# Patient Record
Sex: Female | Born: 1965 | Race: Black or African American | Hispanic: No | Marital: Married | Smoking: Never smoker
Health system: Southern US, Community
[De-identification: ages and names within clinical notes are randomized; demographics above are authoritative.]

## PROBLEM LIST (undated history)

## (undated) DIAGNOSIS — I471 Supraventricular tachycardia, unspecified: Secondary | ICD-10-CM

## (undated) DIAGNOSIS — M7541 Impingement syndrome of right shoulder: Secondary | ICD-10-CM

## (undated) DIAGNOSIS — M7580 Other shoulder lesions, unspecified shoulder: Secondary | ICD-10-CM

## (undated) HISTORY — PX: ENDOMETRIAL ABLATION: SHX621

## (undated) HISTORY — PX: PELVIC LAPAROSCOPY: SHX162

---

## 2007-05-07 ENCOUNTER — Ambulatory Visit: Payer: Self-pay | Admitting: Obstetrics and Gynecology

## 2008-05-19 ENCOUNTER — Ambulatory Visit: Payer: Self-pay | Admitting: Obstetrics and Gynecology

## 2009-01-29 ENCOUNTER — Emergency Department: Payer: Self-pay | Admitting: Emergency Medicine

## 2009-06-01 ENCOUNTER — Ambulatory Visit: Payer: Self-pay | Admitting: Obstetrics and Gynecology

## 2010-06-15 ENCOUNTER — Ambulatory Visit: Payer: Self-pay | Admitting: Women's Health

## 2011-06-27 ENCOUNTER — Ambulatory Visit: Payer: Self-pay | Admitting: Obstetrics and Gynecology

## 2012-08-20 ENCOUNTER — Ambulatory Visit: Payer: Self-pay

## 2015-11-13 ENCOUNTER — Ambulatory Visit
Admission: EM | Admit: 2015-11-13 | Discharge: 2015-11-13 | Disposition: A | Discharge status: Home / Self Care | Payer: BLUE CROSS/BLUE SHIELD | Attending: Family Medicine | Admitting: Family Medicine

## 2015-11-13 ENCOUNTER — Encounter: Payer: Self-pay | Admitting: Gynecology

## 2015-11-13 DIAGNOSIS — J40 Bronchitis, not specified as acute or chronic: Secondary | ICD-10-CM

## 2015-11-13 HISTORY — DX: Other shoulder lesions, unspecified shoulder: M75.80

## 2015-11-13 HISTORY — DX: Impingement syndrome of right shoulder: M75.41

## 2015-11-13 MED ORDER — BENZONATATE 200 MG PO CAPS: 200.0000 mg | ORAL_CAPSULE | Freq: Three times a day (TID) | ORAL | Status: DC

## 2015-11-13 MED ORDER — HYDROCOD POLST-CPM POLST ER 10-8 MG/5ML PO SUER: 5.0000 mL | Freq: Two times a day (BID) | ORAL | Status: DC

## 2015-11-13 MED ORDER — FLUTICASONE PROPIONATE 50 MCG/ACT NA SUSP: 2.0000 | Freq: Every day | NASAL | Status: AC

## 2015-11-13 MED ORDER — AZITHROMYCIN 250 MG PO TABS: ORAL_TABLET | ORAL | Status: DC

## 2015-11-13 NOTE — ED Provider Notes (Signed)
CSN: [id number]     Arrival date & time 11/13/15  2355 History   First MD Initiated Contact with Patient 11/13/15 [phone]     Chief Complaint  Patient presents with  . URI   (Consider location/radiation/quality/duration/timing/severity/associated sxs/prior Treatment) HPI   51 year old female who presents with left ear pain congestion and cough that she's had for 10 days. He said initially she had a laryngitis that eventually he has now become more of a cough and left ear pain with recent feverish feeling. Temperature today 98.1 pulse 84 blood pressure 102/70 with O2 sats of 99%.  Past Medical History  Diagnosis Date  . Rotator cuff tendinitis   . Impingement syndrome of right shoulder    Past Surgical History  Procedure Laterality Date  . Endometrial ablation    . Pelvic laparoscopy     No family history on file. Social History  Substance Use Topics  . Smoking status: Never Smoker   . Smokeless tobacco: None  . Alcohol Use: Yes   OB History    No data available     Review of Systems  Constitutional: Positive for fever, activity change and fatigue.  HENT: Positive for postnasal drip, rhinorrhea and sinus pressure.   Respiratory: Positive for cough.   All other systems reviewed and are negative.   Allergies  Oxycodone and Sulfa antibiotics  Home Medications   Prior to Admission medications   Medication Sig Start Date End Date Taking? Authorizing Provider  Multiple Vitamin (MULTIVITAMIN) tablet Take 1 tablet by mouth daily.   Yes Historical Provider, MD  azithromycin (ZITHROMAX Z-PAK) 250 MG tablet Use as per package instructions 11/13/15   Lutricia Feil, PA-C  benzonatate (TESSALON) 200 MG capsule Take 1 capsule (200 mg total) by mouth every 8 (eight) hours. PRN cough 11/13/15   Lutricia Feil, PA-C  chlorpheniramine-HYDROcodone [facility]) 10-8 MG/5ML SUER Take 5 mLs by mouth 2 (two) times daily. 11/13/15   Lutricia Feil, PA-C  fluticasone (FLONASE)  50 MCG/ACT nasal spray Place 2 sprays into both nostrils daily. 11/13/15   Lutricia Feil, PA-C   Meds Ordered and Administered this Visit  Medications - No data to display  BP 107/70 mmHg  Pulse 84  Temp(Src) 98.1 F (36.7 C) (Oral)  Ht  (1.626 m)  Wt 160 lb (72.576 kg)  BMI 27.45 kg/m2  SpO2 99%  LMP  No data found.   Physical Exam  Constitutional: She is oriented to person, place, and time. She appears well-developed and well-nourished. No distress.  HENT:  Head: Normocephalic and atraumatic.  Right Ear: External ear normal.  Nose: Nose normal.  Mouth/Throat: Oropharynx is clear and moist. No oropharyngeal exudate.  Left TM is dull  Eyes: Conjunctivae are normal. Pupils are equal, round, and reactive to light.  Neck: Normal range of motion. Neck supple.  Pulmonary/Chest: Effort normal and breath sounds normal. No respiratory distress. She has no wheezes. She has no rales.  Musculoskeletal: Normal range of motion. She exhibits no edema or tenderness.  Lymphadenopathy:    She has no cervical adenopathy.  Neurological: She is alert and oriented to person, place, and time.  Skin: Skin is warm and dry. She is not diaphoretic.  Psychiatric: She has a normal mood and affect. Her behavior is normal. Judgment and thought content normal.  Nursing note and vitals reviewed.   ED Course  Procedures (including critical care time)  Labs Review Labs Reviewed - No data to display  Imaging Review  No results found.   Visual Acuity Review  Right Eye Distance:   Left Eye Distance:   Bilateral Distance:    Right Eye Near:   Left Eye Near:    Bilateral Near:         MDM   1. Bronchitis    Discharge Medication List as of 11/13/2015  9:51 AM    START taking these medications   Details  azithromycin (ZITHROMAX Z-PAK) 250 MG tablet Use as per package instructions, Normal    benzonatate (TESSALON) 200 MG capsule Take 1 capsule (200 mg total) by mouth every 8 (eight)  hours. PRN cough, Starting 11/13/2015, Until Discontinued, Normal    chlorpheniramine-HYDROcodone (TUSSIONEX PENNKINETIC ER) 10-8 MG/5ML SUER Take 5 mLs by mouth 2 (two) times daily., Starting 11/13/2015, Until Discontinued, Print    fluticasone (FLONASE) 50 MCG/ACT nasal spray Place 2 sprays into both nostrils daily., Starting 11/13/2015, Until Discontinued, Normal      Plan: 1. Test/x-ray results and diagnosis reviewed with patient 2. rx as per orders; risks, benefits, potential side effects reviewed with patient 3. Recommend supportive treatment with Fluids and rest. Also start her on Flonase for drainage and have recommended the use of Zyrtec Claritin or Allegra. Also start her on a Z-Pak causes length of time she's had the illness which seems to be worsening. If she does not improve she follow-up with her primary care physician or return to our clinic .[facility]. 4. F/u prn if symptoms worsen or don't improve     Lutricia FeilWilliam P Calvary Difranco, PA-C 11/13/15 [phone]

## 2015-11-13 NOTE — ED Notes (Signed)
Patient c/o congestion / cough / left ear pain x 10 days.

## 2015-11-13 NOTE — Discharge Instructions (Signed)
Upper Respiratory Infection, Adult Most upper respiratory infections (URIs) are a viral infection of the air passages leading to the lungs. A URI affects the nose, throat, and upper air passages. The most common type of URI is nasopharyngitis and is typically referred to as "the common cold." URIs run their course and usually go away on their own. Most of the time, a URI does not require medical attention, but sometimes a bacterial infection in the upper airways can follow a viral infection. This is called a secondary infection. Sinus and middle ear infections are common types of secondary upper respiratory infections. Bacterial pneumonia can also complicate a URI. A URI can worsen asthma and chronic obstructive pulmonary disease (COPD). Sometimes, these complications can require emergency medical care and may be life threatening.  CAUSES Almost all URIs are caused by viruses. A virus is a type of germ and can spread from one person to another.  RISKS FACTORS You may be at risk for a URI if:   You smoke.   You have chronic heart or lung disease.  You have a weakened defense (immune) system.   You are very young or very old.   You have nasal allergies or asthma.  You work in crowded or poorly ventilated areas.  You work in health care facilities or schools. SIGNS AND SYMPTOMS  Symptoms typically develop 2-3 days after you come in contact with a cold virus. Most viral URIs last 7-10 days. However, viral URIs from the influenza virus (flu virus) can last 14-18 days and are typically more severe. Symptoms may include:   Runny or stuffy (congested) nose.   Sneezing.   Cough.   Sore throat.   Headache.   Fatigue.   Fever.   Loss of appetite.   Pain in your forehead, behind your eyes, and over your cheekbones (sinus pain).  Muscle aches.  DIAGNOSIS  Your health care provider may diagnose a URI by:  Physical exam.  Tests to check that your symptoms are not due to  another condition such as:  Strep throat.  Sinusitis.  Pneumonia.  Asthma. TREATMENT  A URI goes away on its own with time. It cannot be cured with medicines, but medicines may be prescribed or recommended to relieve symptoms. Medicines may help:  Reduce your fever.  Reduce your cough.  Relieve nasal congestion. HOME CARE INSTRUCTIONS   Take medicines only as directed by your health care provider.   Gargle warm saltwater or take cough drops to comfort your throat as directed by your health care provider.  Use a warm mist humidifier or inhale steam from a shower to increase air moisture. This may make it easier to breathe.  Drink enough fluid to keep your urine clear or pale yellow.   Eat soups and other clear broths and maintain good nutrition.   Rest as needed.   Return to work when your temperature has returned to normal or as your health care provider advises. You may need to stay home longer to avoid infecting others. You can also use a face mask and careful hand washing to prevent spread of the virus.  Increase the usage of your inhaler if you have asthma.   Do not use any tobacco products, including cigarettes, chewing tobacco, or electronic cigarettes. If you need help quitting, ask your health care provider. PREVENTION  The best way to protect yourself from getting a cold is to practice good hygiene.   Avoid oral or hand contact with people with cold   symptoms.   Wash your hands often if contact occurs.  There is no clear evidence that vitamin C, vitamin E, echinacea, or exercise reduces the chance of developing a cold. However, it is always recommended to get plenty of rest, exercise, and practice good nutrition.  SEEK MEDICAL CARE IF:   You are getting worse rather than better.   Your symptoms are not controlled by medicine.   You have chills.  You have worsening shortness of breath.  You have brown or red mucus.  You have yellow or brown nasal  discharge.  You have pain in your face, especially when you bend forward.  You have a fever.  You have swollen neck glands.  You have pain while swallowing.  You have white areas in the back of your throat. SEEK IMMEDIATE MEDICAL CARE IF:   You have severe or persistent:  Headache.  Ear pain.  Sinus pain.  Chest pain.  You have chronic lung disease and any of the following:  Wheezing.  Prolonged cough.  Coughing up blood.  A change in your usual mucus.  You have a stiff neck.  You have changes in your:  Vision.  Hearing.  Thinking.  Mood. MAKE SURE YOU:   Understand these instructions.  Will watch your condition.  Will get help right away if you are not doing well or get worse.   This information is not intended to replace advice given to you by your health care provider. Make sure you discuss any questions you have with your health care provider.   Document Released: 01/31/2001 Document Revised: 12/22/2014 Document Reviewed: 11/12/2013 Elsevier Interactive Patient Education 2016 [employer]  

## 2016-12-29 ENCOUNTER — Emergency Department: Payer: BLUE CROSS/BLUE SHIELD

## 2016-12-29 ENCOUNTER — Encounter: Payer: Self-pay | Admitting: Emergency Medicine

## 2016-12-29 ENCOUNTER — Emergency Department
Admission: EM | Admit: 2016-12-29 | Discharge: 2016-12-29 | Disposition: A | Discharge status: Home / Self Care | Payer: BLUE CROSS/BLUE SHIELD | Attending: Emergency Medicine | Admitting: Emergency Medicine

## 2016-12-29 DIAGNOSIS — R000 Tachycardia, unspecified: Secondary | ICD-10-CM | POA: Diagnosis present

## 2016-12-29 DIAGNOSIS — I471 Supraventricular tachycardia: Secondary | ICD-10-CM | POA: Diagnosis not present

## 2016-12-29 LAB — CBC WITH DIFFERENTIAL/PLATELET
BASOS ABS: 0.1 10*3/uL (ref 0–0.1)
BASOS PCT: 1 %
EOS PCT: 1 %
Eosinophils Absolute: 0.1 10*3/uL (ref 0–0.7)
HCT: 40 % (ref 35.0–47.0)
Hemoglobin: 13.5 g/dL (ref 12.0–16.0)
Lymphocytes Relative: 26 %
Lymphs Abs: 2.2 10*3/uL (ref 1.0–3.6)
MCH: 32.2 pg (ref 26.0–34.0)
MCHC: 33.7 g/dL (ref 32.0–36.0)
MCV: 95.4 fL (ref 80.0–100.0)
MONO ABS: 0.4 10*3/uL (ref 0.2–0.9)
Monocytes Relative: 4 %
Neutro Abs: 5.9 10*3/uL (ref 1.4–6.5)
Neutrophils Relative %: 68 %
PLATELETS: 206 10*3/uL (ref 150–440)
RBC: 4.2 MIL/uL (ref 3.80–5.20)
RDW: 13.3 % (ref 11.5–14.5)
WBC: 8.6 10*3/uL (ref 3.6–11.0)

## 2016-12-29 LAB — TROPONIN I: Troponin I: <0.03 ng/mL (ref <0.03)

## 2016-12-29 LAB — BASIC METABOLIC PANEL
Anion gap: 11 (ref 5–15)
BUN: 17 mg/dL (ref 6–20)
CALCIUM: 9.2 mg/dL (ref 8.9–10.3)
CO2: 21 mmol/L — ABNORMAL LOW (ref 22–32)
CREATININE: 0.91 mg/dL (ref 0.44–1.00)
Chloride: 103 mmol/L (ref 101–111)
GFR calc non Af Amer: >60 mL/min (ref >60)
Glucose, Bld: 142 mg/dL — ABNORMAL HIGH (ref 65–99)
Potassium: 4.5 mmol/L (ref 3.5–5.1)
SODIUM: 135 mmol/L (ref 135–145)

## 2016-12-29 LAB — FIBRIN DERIVATIVES D-DIMER (ARMC ONLY): FIBRIN DERIVATIVES D-DIMER (ARMC): 645.55 — AB (ref 0.00–499.00)

## 2016-12-29 LAB — HCG, QUANTITATIVE, PREGNANCY: HCG, BETA CHAIN, QUANT, S: 4 m[IU]/mL (ref <5)

## 2016-12-29 MED ORDER — ADENOSINE 6 MG/2ML IV SOLN
INTRAVENOUS | Status: AC
  Filled 2016-12-29: qty 2

## 2016-12-29 MED ORDER — ONDANSETRON HCL 4 MG/2ML IJ SOLN
4.0000 mg | Freq: Once | INTRAMUSCULAR | Status: AC
  Administered 2016-12-29: 4 mg via INTRAVENOUS
  Filled 2016-12-29: qty 2

## 2016-12-29 MED ORDER — ADENOSINE 12 MG/4ML IV SOLN
INTRAVENOUS | Status: AC
  Filled 2016-12-29: qty 4

## 2016-12-29 MED ORDER — IOPAMIDOL (ISOVUE-370) INJECTION 76%
75.0000 mL | Freq: Once | INTRAVENOUS | Status: AC | PRN
  Administered 2016-12-29: 75 mL via INTRAVENOUS

## 2016-12-29 MED ORDER — SODIUM CHLORIDE 0.9 % IV BOLUS (SEPSIS)
1000.0000 mL | Freq: Once | INTRAVENOUS | Status: AC
  Administered 2016-12-29: 1000 mL via INTRAVENOUS

## 2016-12-29 NOTE — ED Notes (Addendum)
Dr. Fanny BienQuale at bedside with patient. Dr. Fanny BienQuale instructed pt to take deep breath and slowly breathe out. Pt converted to sinus tachycardia.

## 2016-12-29 NOTE — ED Triage Notes (Signed)
Pt was at the gym when she felt shob, states no hx of the same.

## 2016-12-29 NOTE — ED Provider Notes (Signed)
[facility] Emergency Department Provider Note   ____________________________________________   First MD Initiated Contact with Patient 12/29/16 [phone]     (approximate)  I have reviewed the triage vital signs and the nursing notes.   HISTORY  Chief Complaint Tachycardia    HPI Carmen Irwin is a 51 y.o. female who is in her normal state of health. She went to the gym this morning, after doing jumping jacks she knows that her heart rate continued to be elevated despite rest. She felt slightly short of breath with it, and feels her heart continues to race. This is never happened before. Her gym heart rate monitor indicatedthat her heart rate was continuing on a very fast rate despite rest.  Occasionally uses coffee, no prescription medications except for iron tablets for anemia. No caffeine taken today. Reports she has been under a fair amount of stress recently.  No chest pain. Feels slightly tired with this. Denies any pain with deep inspiration. No history of any blood clots. Denies previous history of any heart trouble or family history. She does not smoke. Does not take any estrogen. No leg swelling long trips travels or recent surgery.  Presently reports a sensation that is hard to describe her chest like her heart just continues to race.   Past Medical History:  Diagnosis Date  . Impingement syndrome of right shoulder   . Rotator cuff tendinitis     There are no active problems to display for this patient.   Past Surgical History:  Procedure Laterality Date  . ENDOMETRIAL ABLATION    . PELVIC LAPAROSCOPY      Prior to Admission medications   Medication Sig Start Date End Date Taking? Authorizing Provider  acidophilus (RISAQUAD) CAPS capsule Take 1 capsule by mouth daily.   Yes [provider]  ferrous sulfate 325 (65 FE) MG tablet Take 325 mg by mouth daily. 12/20/16  Yes [provider]  fluticasone (FLONASE) 50 MCG/ACT  nasal spray Place 2 sprays into both nostrils daily. 11/13/15  Yes Lutricia Feil, PA-C  Multiple Vitamin (MULTIVITAMIN) tablet Take 1 tablet by mouth daily.   Yes [provider]  Vitamin D, Ergocalciferol, (DRISDOL) 50000 units CAPS capsule Take 1 capsule by mouth once a week. 12/20/16  Yes [provider]  azithromycin (ZITHROMAX Z-PAK) 250 MG tablet Use as per package instructions Patient not taking: Reported on 12/29/2016 11/13/15   Lutricia Feil, PA-C  benzonatate (TESSALON) 200 MG capsule Take 1 capsule (200 mg total) by mouth every 8 (eight) hours. PRN cough Patient not taking: Reported on 12/29/2016 11/13/15   Lutricia Feil, PA-C  chlorpheniramine-HYDROcodone [facility] ER) 10-8 MG/5ML SUER Take 5 mLs by mouth 2 (two) times daily. Patient not taking: Reported on 12/29/2016 11/13/15   Lutricia Feil, PA-C    Allergies Oxycodone and Sulfa antibiotics  No family history on file.  Social History Social History  Substance Use Topics  . Smoking status: Never Smoker  . Smokeless tobacco: Not on file  . Alcohol use Yes    Review of Systems Constitutional: No fever/chills Eyes: No visual changes. ENT: No sore throat. Cardiovascular: Denies chest pain. Respiratory: See history of present illness Gastrointestinal: No abdominal pain.  No nausea, no vomiting.  No diarrhea.  No constipation. Denies pregnancy Genitourinary: Negative for dysuria. Musculoskeletal: Negative for back pain. Skin: Negative for rash. Neurological: Negative for headaches, focal weakness or numbness.  10-point ROS otherwise negative.  ____________________________________________   PHYSICAL  EXAM:  VITAL SIGNS: ED Triage Vitals  Enc Vitals Group     BP 12/29/16 0700 97/63     Pulse Rate 12/29/16 0700 (!) 181     Resp 12/29/16 0700 13     Temp --      Temp src --      SpO2 12/29/16 0700 100 %     Weight 12/29/16 0704 159 lb (72.1 kg)     Height 12/29/16 0704 5\' 4"   (1.626 m)     Head Circumference --      Peak Flow --      Pain Score --      Pain Loc --      Pain Edu? --      Excl. in GC? --     Constitutional: Alert and oriented. Well appearing and in no acute distress.Very pleasant, slightly anxious. Eyes: Conjunctivae are normal. PERRL. EOMI. Head: Atraumatic. Nose: No congestion/rhinnorhea. Mouth/Throat: Mucous membranes are moist.  Oropharynx non-erythematous. Neck: No stridor.   Cardiovascular: Tachycardic rate, regular rhythm. Grossly normal heart sounds.  Good peripheral circulation. Respiratory: Normal respiratory effort.  No retractions. Lungs CTAB. Gastrointestinal: Soft and nontender.  Musculoskeletal: No lower extremity tenderness nor edema.  No joint effusions. No unilateral leg swelling. Neurologic:  Normal speech and language. No gross focal neurologic deficits are appreciated.  Skin:  Skin is warm, dry and intact. No rash noted. Psychiatric: Mood and affect are normal. Speech and behavior are normal.  ____________________________________________   LABS (all labs ordered are listed, but only abnormal results are displayed)  Labs Reviewed  BASIC METABOLIC PANEL - Abnormal; Notable for the following:       Result Value   CO2 21 (*)    Glucose, Bld 142 (*)    All other components within normal limits  FIBRIN DERIVATIVES D-DIMER ([facility] ONLY) - Abnormal; Notable for the following:    Fibrin derivatives D-dimer ([facility]) 645.55 (*)    All other components within normal limits  CBC WITH DIFFERENTIAL/PLATELET  TROPONIN I  HCG, QUANTITATIVE, PREGNANCY   ____________________________________________  EKG  Initial EKG reviewed and interpreted by me at 7 AM Heart rate 170 QRS 80 QTc 470 Supraventricular tachycardia without evidence of ischemia  Repeat EKG was performed at 7:11 AM Heart rate 110 QRS 98 QTc 420 Sinus tachycardia, no evidence of prolonged QT, ischemic change or Brugada  noted. ____________________________________________  RADIOLOGY Dg Chest 2 View  Result Date: 12/29/2016 CLINICAL DATA:  Increased heart rate EXAM: CHEST  2 VIEW COMPARISON:  None. FINDINGS: Borderline cardiomegaly. Lungs clear. No pneumothorax. No pleural effusion. IMPRESSION: No active cardiopulmonary disease. Electronically Signed   By: Jolaine Click M.D.   On: 12/29/2016 07:56   Ct Angio Chest Pe W Or Wo Contrast  Result Date: 12/29/2016 CLINICAL DATA:  Shortness of breath.  Elevated D-dimer level. EXAM: CT ANGIOGRAPHY CHEST WITH CONTRAST TECHNIQUE: Multidetector CT imaging of the chest was performed using the standard protocol during bolus administration of intravenous contrast. Multiplanar CT image reconstructions and MIPs were obtained to evaluate the vascular anatomy. CONTRAST:  75 mL of Isovue 370 intravenously. COMPARISON:  Radiographs of same day. FINDINGS: Cardiovascular: Satisfactory opacification of the pulmonary arteries to the segmental level. No evidence of pulmonary embolism. Normal heart size. No pericardial effusion. Mediastinum/Nodes: No enlarged mediastinal, hilar, or axillary lymph nodes. Thyroid gland, trachea, and esophagus demonstrate no significant findings. Lungs/Pleura: Lungs are clear. No pleural effusion or pneumothorax. Upper Abdomen: No acute abnormality. Musculoskeletal: No chest wall abnormality. No acute  or significant osseous findings. Review of the MIP images confirms the above findings. IMPRESSION: No definite evidence of pulmonary embolus. No acute abnormality seen in the chest. Electronically Signed   By: Lupita RaiderJames  Green Jr, M.D.   On: 12/29/2016 10:13     ____________________________________________   PROCEDURES  Procedure(s) performed: None  Procedures  Critical Care performed: No  ____________________________________________   INITIAL IMPRESSION / ASSESSMENT AND PLAN / ED COURSE  Pertinent labs & imaging results that were available during my care  of the patient were reviewed by me and considered in my medical decision making (see chart for details).  Patient presents with persistently elevated heart rate, EKG consistent with SVT. After vaginal maneuver, patient's heart rate improved into the with normal sinus rhythm and a normal-appearing 12-lead.  Denies chest pain. No evidence of acute coronary syndrome on EKG. Patient low risk for ACS.  Patient low risk by well's. Given an initial presentation, associated feeling of dyspnea with severely elevated heart rate I will screen for pulmonary embolism. D-dimer. Low pretest probability.     ----------------------------------------- 9:08 AM on 12/29/2016 -----------------------------------------  Patient resting comfortably, explain reasoning for CT scan to exclude pulmonary embolism. Patient reports no concerns, resting comfortably.  Vitals:   12/29/16 1030 12/29/16 1052  BP: 98/76 108/65  Pulse: 72 88  Resp: 18 18    ____________________________________________   FINAL CLINICAL IMPRESSION(S) / ED DIAGNOSES  Final diagnoses:  Tachycardia  SVT (supraventricular tachycardia) (HCC)      NEW MEDICATIONS STARTED DURING THIS VISIT:  Discharge Medication List as of 12/29/2016 10:52 AM       Note:  This document was prepared using Dragon voice recognition software and may include unintentional dictation errors.     Sharyn CreamerQuale, Izaih Kataoka, MD 12/29/16 1204

## 2016-12-29 NOTE — Discharge Instructions (Signed)
As we discussed, your signs and symptoms strongly suggest a condition called upraventricular tachycardia (SVT).  This is a generally benign condition, but we recommend you follow up with the listed cardiologist for further evaluation.  Please read through the included information and try some of the techniques we discussed the next time you have the symptoms.  Return to the Emergency Department if you develop new or worsening symptoms that concern you or develop chest pain, shortness of breath, you pass out, or other new concerns arise.

## 2016-12-29 NOTE — ED Triage Notes (Signed)
Patient ambulatory to triage with steady gait, without difficulty or distress noted; pt reports sensation of heart racing and SHOB; HR noted 180's; pt taken immed to room 1 via w/c by Raquel, RN for further eval & treatment; pt denies hx of same, denies any pain but st has been under a lot of stress recently

## 2017-04-11 ENCOUNTER — Encounter: Payer: Self-pay | Admitting: Emergency Medicine

## 2017-04-11 ENCOUNTER — Ambulatory Visit
Admission: EM | Admit: 2017-04-11 | Discharge: 2017-04-11 | Disposition: A | Discharge status: Home / Self Care | Payer: BLUE CROSS/BLUE SHIELD | Attending: Emergency Medicine | Admitting: Emergency Medicine

## 2017-04-11 DIAGNOSIS — N390 Urinary tract infection, site not specified: Secondary | ICD-10-CM | POA: Diagnosis not present

## 2017-04-11 DIAGNOSIS — R319 Hematuria, unspecified: Secondary | ICD-10-CM

## 2017-04-11 HISTORY — DX: Supraventricular tachycardia: I47.1

## 2017-04-11 HISTORY — DX: Supraventricular tachycardia, unspecified: I47.10

## 2017-04-11 LAB — URINALYSIS, COMPLETE (UACMP) WITH MICROSCOPIC
Bilirubin Urine: NEGATIVE
Glucose, UA: NEGATIVE mg/dL
Ketones, ur: NEGATIVE mg/dL
Nitrite: NEGATIVE
SPECIFIC GRAVITY, URINE: 1.01 (ref 1.005–1.030)
pH: 6 (ref 5.0–8.0)

## 2017-04-11 MED ORDER — NITROFURANTOIN MONOHYD MACRO 100 MG PO CAPS: 100.0000 mg | ORAL_CAPSULE | Freq: Two times a day (BID) | ORAL | 0 refills | Status: DC

## 2017-04-11 MED ORDER — PHENAZOPYRIDINE HCL 200 MG PO TABS: 200.0000 mg | ORAL_TABLET | Freq: Three times a day (TID) | ORAL | 0 refills | Status: DC | PRN

## 2017-04-11 NOTE — ED Triage Notes (Signed)
Patient c/o burning when urinating and increase in urinary frequency that started last night.   

## 2017-04-11 NOTE — ED Provider Notes (Signed)
HPI  SUBJECTIVE:  Carmen Irwin is a 51 y.o. female who presents with urinary urgency, frequency, cloudy or odorous urine, hematuria malaise starting earlier today. She tried Advil and pushing fluids with some improvement or symptoms. No aggravating factors. She denies nausea, vomiting, fevers, abdominal pain pelvic pain back pain. No vaginal bleeding, odor, genital rash or discharge. No antibiotics in the past month. She did start using a new perfumed  body wash. She states this is identical to previous UTIs. She is in a long-term monogamous relationship with her husband who is asymptomatic, STDs are not a concern today. She has past medical history of UTIs. No history of pyelonephritis, nephrolithiasis, diabetes, hypertension, gonorrhea, chlamydia, HIV, HSV, syphilis, Trichomonas. No history of PID. She has had a history of BV, yeast. LMP: Postmenopausal. Family history negative for nephrolithiasis. PMD: Dr. Lorre Munroe.   Past Medical History:  Diagnosis Date  . Impingement syndrome of right shoulder   . Rotator cuff tendinitis   . SVT (supraventricular tachycardia) (HCC)     Past Surgical History:  Procedure Laterality Date  . ENDOMETRIAL ABLATION    . PELVIC LAPAROSCOPY      History reviewed. No pertinent family history.  Social History  Substance Use Topics  . Smoking status: Never Smoker  . Smokeless tobacco: Never Used  . Alcohol use Yes    No current facility-administered medications for this encounter.   Current Outpatient Prescriptions:  .  acidophilus (RISAQUAD) CAPS capsule, Take 1 capsule by mouth daily., Disp: , Rfl:  .  fluticasone (FLONASE) 50 MCG/ACT nasal spray, Place 2 sprays into both nostrils daily., Disp: 16 g, Rfl: 0 .  Multiple Vitamin (MULTIVITAMIN) tablet, Take 1 tablet by mouth daily., Disp: , Rfl:  .  nitrofurantoin, macrocrystal-monohydrate, (MACROBID) 100 MG capsule, Take 1 capsule (100 mg total) by mouth 2 (two) times daily. X 5 days, Disp: 10 capsule,  Rfl: 0 .  phenazopyridine (PYRIDIUM) 200 MG tablet, Take 1 tablet (200 mg total) by mouth 3 (three) times daily as needed for pain., Disp: 6 tablet, Rfl: 0  Allergies  Allergen Reactions  . Oxycodone Nausea Only  . Sulfa Antibiotics Rash     ROS  As noted in HPI.   Physical Exam  BP 101/63 (BP Location: Left Arm)   Pulse 88   Temp 98.3 F (36.8 C) (Oral)   Resp 14   Ht 5\' 4"  (1.626 m)   Wt 159 lb (72.1 kg)   SpO2 99%   BMI 27.29 kg/m   Constitutional: Well developed, well nourished, no acute distress Eyes:  EOMI, conjunctiva normal bilaterally HENT: Normocephalic, atraumatic,mucus membranes moist Respiratory: Normal inspiratory effort Cardiovascular: Normal rate GI: nondistended. Soft, no suprapubic, flank tenderness Back: No CVA tenderness GU: Deferred skin: No rash, skin intact Musculoskeletal: no deformities Neurologic: Alert & oriented x 3, no focal neuro deficits Psychiatric: Speech and behavior appropriate   ED Course   Medications - No data to display  Orders Placed This Encounter  Procedures  . Urine culture    Standing Status:   Standing    Number of Occurrences:   1    Order Specific Question:   List patient's active antibiotics    Answer:   macrobid    Order Specific Question:   Patient immune status    Answer:   Normal  . Urinalysis, Complete w Microscopic    Standing Status:   Standing    Number of Occurrences:   1    Results for orders placed  or performed during the hospital encounter of 04/11/17 (from the past 24 hour(s))  Urinalysis, Complete w Microscopic     Status: Abnormal   Collection Time: 04/11/17  5:14 PM  Result Value Ref Range   Color, Urine YELLOW YELLOW   APPearance HAZY (A) CLEAR   Specific Gravity, Urine 1.010 1.005 - 1.030   pH 6.0 5.0 - 8.0   Glucose, UA NEGATIVE NEGATIVE mg/dL   Hgb urine dipstick LARGE (A) NEGATIVE   Bilirubin Urine NEGATIVE NEGATIVE   Ketones, ur NEGATIVE NEGATIVE mg/dL   Protein, ur TRACE (A)  NEGATIVE mg/dL   Nitrite NEGATIVE NEGATIVE   Leukocytes, UA SMALL (A) NEGATIVE   Squamous Epithelial / LPF 0-5 (A) NONE SEEN   WBC, UA TOO NUMEROUS TO COUNT 0 - 5 WBC/hpf   RBC / HPF 6-30 0 - 5 RBC/hpf   Bacteria, UA FEW (A) NONE SEEN   No results found.  ED Clinical Impression  Urinary tract infection with hematuria, site unspecified   ED Assessment/Plan  UA suggestive of a UTI. Sending off for culture to confirm antibiotic choice. Home with Macrobid, Pyridium. She will return here or follow-up with her doctor if she is not getting any better for a pelvic and consideration of GYN causes of her symptoms.  Discussed labs,  MDM, plan and followup with patient Discussed sn/sx that should prompt return to the ED. Patient agrees with plan.   Meds ordered this encounter  Medications  . phenazopyridine (PYRIDIUM) 200 MG tablet    Sig: Take 1 tablet (200 mg total) by mouth 3 (three) times daily as needed for pain.    Dispense:  6 tablet    Refill:  0  . nitrofurantoin, macrocrystal-monohydrate, (MACROBID) 100 MG capsule    Sig: Take 1 capsule (100 mg total) by mouth 2 (two) times daily. X 5 days    Dispense:  10 capsule    Refill:  0    *This clinic note was created using Scientist, clinical (histocompatibility and immunogenetics). Therefore, there may be occasional mistakes despite careful proofreading.  ?   Domenick Gong, MD 04/11/17 2001

## 2017-04-14 LAB — URINE CULTURE
Culture: >=100000 — AB
Special Requests: NORMAL

## 2017-08-10 ENCOUNTER — Telehealth: Payer: Self-pay | Admitting: Gastroenterology

## 2017-08-10 NOTE — Telephone Encounter (Signed)
Please call patient to schedule colonoscopy. 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

## 2017-08-13 ENCOUNTER — Other Ambulatory Visit: Payer: Self-pay

## 2017-08-13 DIAGNOSIS — Z1211 Encounter for screening for malignant neoplasm of colon: Secondary | ICD-10-CM

## 2017-08-13 NOTE — Telephone Encounter (Signed)
Gastroenterology Pre-Procedure Review  Request Date: 09/10/17 Requesting Physician: Dr. Tobi BastosAnna  PATIENT REVIEW QUESTIONS: The patient responded to the following health history questions as indicated:    1. Are you having any GI issues? no 2. Do you have a personal history of Polyps? no 3. Do you have a family history of Colon Cancer or Polyps? no 4. Diabetes Mellitus? no 5. Joint replacements in the past 12 months?no 6. Major health problems in the past 3 months?yes (SVT) 7. Any artificial heart valves, MVP, or defibrillator?no    MEDICATIONS & ALLERGIES:    Patient reports the following regarding taking any anticoagulation/antiplatelet therapy:   Plavix, Coumadin, Eliquis, Xarelto, Lovenox, Pradaxa, Brilinta, or Effient? no Aspirin? no  Patient confirms/reports the following medications:  Current Outpatient Medications  Medication Sig Dispense Refill  . acidophilus (RISAQUAD) CAPS capsule Take 1 capsule by mouth daily.    . fluticasone (FLONASE) 50 MCG/ACT nasal spray Place 2 sprays into both nostrils daily. 16 g 0  . Multiple Vitamin (MULTIVITAMIN) tablet Take 1 tablet by mouth daily.    . nitrofurantoin, macrocrystal-monohydrate, (MACROBID) 100 MG capsule Take 1 capsule (100 mg total) by mouth 2 (two) times daily. X 5 days 10 capsule 0  . phenazopyridine (PYRIDIUM) 200 MG tablet Take 1 tablet (200 mg total) by mouth 3 (three) times daily as needed for pain. 6 tablet 0   No current facility-administered medications for this visit.     Patient confirms/reports the following allergies:  Allergies  Allergen Reactions  . Oxycodone Nausea Only  . Sulfa Antibiotics Rash    No orders of the defined types were placed in this encounter.   AUTHORIZATION INFORMATION Primary Insurance: 1D#: Group #:  Secondary Insurance: 1D#: Group #:  SCHEDULE INFORMATION: Date: 09/10/17 Time: Location:[facility]

## 2017-09-10 ENCOUNTER — Ambulatory Visit
Admission: RE | Admit: 2017-09-10 | Discharge: 2017-09-10 | Disposition: A | Discharge status: Home / Self Care | Payer: BLUE CROSS/BLUE SHIELD | Source: Ambulatory Visit | Attending: Gastroenterology | Admitting: Gastroenterology

## 2017-09-10 ENCOUNTER — Ambulatory Visit: Payer: BLUE CROSS/BLUE SHIELD | Admitting: Anesthesiology

## 2017-09-10 ENCOUNTER — Encounter
Admission: RE | Disposition: A | Discharge status: Home / Self Care | Payer: Self-pay | Source: Ambulatory Visit | Attending: Gastroenterology

## 2017-09-10 DIAGNOSIS — Z1211 Encounter for screening for malignant neoplasm of colon: Secondary | ICD-10-CM | POA: Diagnosis present

## 2017-09-10 DIAGNOSIS — I471 Supraventricular tachycardia: Secondary | ICD-10-CM | POA: Insufficient documentation

## 2017-09-10 DIAGNOSIS — Z79899 Other long term (current) drug therapy: Secondary | ICD-10-CM | POA: Insufficient documentation

## 2017-09-10 DIAGNOSIS — M1990 Unspecified osteoarthritis, unspecified site: Secondary | ICD-10-CM | POA: Diagnosis not present

## 2017-09-10 HISTORY — PX: COLONOSCOPY WITH PROPOFOL: SHX5780

## 2017-09-10 LAB — POCT PREGNANCY, URINE: PREG TEST UR: NEGATIVE

## 2017-09-10 SURGERY — COLONOSCOPY WITH PROPOFOL
Anesthesia: General

## 2017-09-10 MED ORDER — PHENYLEPHRINE HCL 10 MG/ML IJ SOLN
INTRAMUSCULAR | Status: DC | PRN
  Administered 2017-09-10 (×2): 50 ug via INTRAVENOUS

## 2017-09-10 MED ORDER — PROPOFOL 500 MG/50ML IV EMUL
INTRAVENOUS | Status: DC | PRN
  Administered 2017-09-10: 140 ug/kg/min via INTRAVENOUS

## 2017-09-10 MED ORDER — LIDOCAINE HCL (PF) 1 % IJ SOLN
2.0000 mL | Freq: Once | INTRAMUSCULAR | Status: AC
  Administered 2017-09-10: 0.3 mL via INTRADERMAL

## 2017-09-10 MED ORDER — LIDOCAINE HCL (PF) 1 % IJ SOLN
INTRAMUSCULAR | Status: AC
  Administered 2017-09-10: 0.3 mL via INTRADERMAL
  Filled 2017-09-10: qty 2

## 2017-09-10 MED ORDER — LIDOCAINE HCL (CARDIAC) 20 MG/ML IV SOLN
INTRAVENOUS | Status: DC | PRN
  Administered 2017-09-10: 40 mg via INTRAVENOUS

## 2017-09-10 MED ORDER — PROPOFOL 10 MG/ML IV BOLUS
INTRAVENOUS | Status: DC | PRN
  Administered 2017-09-10: 70 mg via INTRAVENOUS

## 2017-09-10 MED ORDER — SODIUM CHLORIDE 0.9 % IV SOLN
INTRAVENOUS | Status: DC
  Administered 2017-09-10: 1000 mL via INTRAVENOUS

## 2017-09-10 NOTE — Op Note (Signed)
[facility] Gastroenterology Patient Name: Carmen Irwin Procedure Date: 09/10/2017 9:40 AM MRN: [id number] Account #: [id number] Date of Birth: 28-Mar-1966 Admit Type: Outpatient Age: 52 Room: [facility] ENDO ROOM 4 Gender: Female Note Status: Finalized Procedure:            Colonoscopy Indications:          Screening for colorectal malignant neoplasm Providers:            Wyline Mood MD, MD Referring MD:         Beryle Quant. Toya Smothers, MD (Referring MD) Medicines:            Monitored Anesthesia Care Complications:        No immediate complications. Procedure:            Pre-Anesthesia Assessment:                       - Prior to the procedure, a History and Physical was                        performed, and patient medications, allergies and                        sensitivities were reviewed. The patient's tolerance of                        previous anesthesia was reviewed.                       - The risks and benefits of the procedure and the                        sedation options and risks were discussed with the                        patient. All questions were answered and informed                        consent was obtained.                       - ASA Grade Assessment: II - A patient with mild                        systemic disease.                       After obtaining informed consent, the colonoscope was                        passed under direct vision. Throughout the procedure,                        the patient's blood pressure, pulse, and oxygen                        saturations were monitored continuously. The                        Colonoscope was introduced through the anus and  advanced to the the cecum, identified by the                        appendiceal orifice, IC valve and transillumination.                        The colonoscopy was performed with ease. The patient                        tolerated the procedure well.  The quality of the bowel                        preparation was good. Findings:      The perianal and digital rectal examinations were normal.      The entire examined colon appeared normal on direct and retroflexion       views. Impression:           - The entire examined colon is normal on direct and                        retroflexion views.                       - No specimens collected. Recommendation:       - Discharge patient to home (with escort).                       - Resume previous diet.                       - Continue present medications.                       - Repeat colonoscopy in 10 years for screening purposes. Procedure Code(s):    --- Professional ---                       [phone], Colonoscopy, flexible; diagnostic, including                        collection of specimen(s) by brushing or washing, when                        performed (separate procedure) Diagnosis Code(s):    --- Professional ---                       Z12.11, Encounter for screening for malignant neoplasm                        of colon CPT copyright 2016 American Medical Association. All rights reserved. The codes documented in this report are preliminary and upon coder review may  be revised to meet current compliance requirements. Wyline MoodKiran Alphonsa Brickle, MD Wyline MoodKiran Markiya Keefe MD, MD 09/10/2017 9:58:51 AM This report has been signed electronically. Number of Addenda: 0 Note Initiated On: 09/10/2017 9:40 AM Scope Withdrawal Time: 0 hours 11 minutes 8 seconds  Total Procedure Duration: 0 hours 13 minutes 7 seconds       [facility]

## 2017-09-10 NOTE — Transfer of Care (Signed)
Immediate Anesthesia Transfer of Care Note  Patient: Carmen Irwin  Procedure(s) Performed: COLONOSCOPY WITH PROPOFOL (N/A )  Patient Location: PACU  Anesthesia Type:General  Level of Consciousness: awake, alert  and oriented  Airway & Oxygen Therapy: Patient Spontanous Breathing and Patient connected to nasal cannula oxygen  Post-op Assessment: Report given to RN and Post -op Vital signs reviewed and stable  Post vital signs: Reviewed and stable  Last Vitals:  Vitals:   09/10/17 0905 09/10/17 1002  BP: 111/67 100/65  Pulse: 83 75  Resp: 17 14  Temp: 36.8 C 36.7 C  SpO2: 100% 100%    Last Pain:  Vitals:   09/10/17 0905  TempSrc: Tympanic         Complications: No apparent anesthesia complications

## 2017-09-10 NOTE — Anesthesia Postprocedure Evaluation (Signed)
Anesthesia Post Note  Patient: Carmen Irwin  Procedure(s) Performed: COLONOSCOPY WITH PROPOFOL (N/A )  Patient location during evaluation: Endoscopy Anesthesia Type: General Level of consciousness: awake and alert and oriented Pain management: pain level controlled Vital Signs Assessment: post-procedure vital signs reviewed and stable Respiratory status: spontaneous breathing, nonlabored ventilation and respiratory function stable Cardiovascular status: blood pressure returned to baseline and stable Postop Assessment: no signs of nausea or vomiting Anesthetic complications: no     Last Vitals:  Vitals:   09/10/17 1012 09/10/17 1022  BP: 94/70 94/68  Pulse: 67 64  Resp: 15 11  Temp:    SpO2: 100% 100%    Last Pain:  Vitals:   09/10/17 1022  TempSrc:   PainSc: 0-No pain                 Joyice Magda

## 2017-09-10 NOTE — Anesthesia Preprocedure Evaluation (Signed)
Anesthesia Evaluation  Patient identified by MRN, date of birth, ID band Patient awake    Reviewed: Allergy & Precautions, NPO status , Patient's Chart, lab work & pertinent test results  History of Anesthesia Complications Negative for: history of anesthetic complications  Airway Mallampati: III  TM Distance: >3 FB Neck ROM: Full    Dental no notable dental hx.    Pulmonary neg pulmonary ROS, neg sleep apnea, neg COPD,    breath sounds clear to auscultation- rhonchi (-) wheezing      Cardiovascular Exercise Tolerance: Good (-) hypertension(-) CAD and (-) Past MI + dysrhythmias (only one episode in past) Supra Ventricular Tachycardia  Rhythm:Regular Rate:Normal - Systolic murmurs and - Diastolic murmurs    Neuro/Psych negative neurological ROS  negative psych ROS   GI/Hepatic negative GI ROS, Neg liver ROS,   Endo/Other  negative endocrine ROSneg diabetes  Renal/GU negative Renal ROS     Musculoskeletal  (+) Arthritis ,   Abdominal (+) - obese,   Peds  Hematology negative hematology ROS (+)   Anesthesia Other Findings Past Medical History: No date: Impingement syndrome of right shoulder No date: Rotator cuff tendinitis No date: SVT (supraventricular tachycardia) (HCC)   Reproductive/Obstetrics                             Anesthesia Physical Anesthesia Plan  ASA: II  Anesthesia Plan: General   Post-op Pain Management:    Induction: Intravenous  PONV Risk Score and Plan: 2 and Propofol infusion  Airway Management Planned: Natural Airway  Additional Equipment:   Intra-op Plan:   Post-operative Plan:   Informed Consent: I have reviewed the patients History and Physical, chart, labs and discussed the procedure including the risks, benefits and alternatives for the proposed anesthesia with the patient or authorized representative who has indicated his/her understanding and  acceptance.   Dental advisory given  Plan Discussed with: CRNA and Anesthesiologist  Anesthesia Plan Comments:         Anesthesia Quick Evaluation

## 2017-09-10 NOTE — H&P (Signed)
Wyline Mood, MD [address], [place], [place], [address] [address], [place], [place], [address] Phone: [phone]  Fax: [phone]  Primary Care Physician:  Cassell Clement, MD   Pre-Procedure History & Physical: HPI:  Carmen Irwin is a 52 y.o. female is here for an colonoscopy.   Past Medical History:  Diagnosis Date  . Impingement syndrome of right shoulder   . Rotator cuff tendinitis   . SVT (supraventricular tachycardia) (HCC)     Past Surgical History:  Procedure Laterality Date  . ENDOMETRIAL ABLATION    . PELVIC LAPAROSCOPY      Prior to Admission medications   Medication Sig Start Date End Date Taking? Authorizing Provider  acidophilus (RISAQUAD) CAPS capsule Take 1 capsule by mouth daily.    [provider]  fluticasone (FLONASE) 50 MCG/ACT nasal spray Place 2 sprays into both nostrils daily. 11/13/15   Lutricia Feil, PA-C  Multiple Vitamin (MULTIVITAMIN) tablet Take 1 tablet by mouth daily.    [provider]  nitrofurantoin, macrocrystal-monohydrate, (MACROBID) 100 MG capsule Take 1 capsule (100 mg total) by mouth 2 (two) times daily. X 5 days 04/11/17   Domenick Gong, MD  phenazopyridine (PYRIDIUM) 200 MG tablet Take 1 tablet (200 mg total) by mouth 3 (three) times daily as needed for pain. 04/11/17   Domenick Gong, MD    Allergies as of 08/16/2017 - Review Complete 04/11/2017  Allergen Reaction Noted  . Oxycodone Nausea Only 11/13/2015  . Sulfa antibiotics Rash 11/13/2015    No family history on file.  Social History   Socioeconomic History  . Marital status: Married    Spouse name: Not on file  . Number of children: Not on file  . Years of education: Not on file  . Highest education level: Not on file  Social Needs  . Financial resource strain: Not on file  . Food insecurity - worry: Not on file  . Food insecurity - inability: Not on file  . Transportation needs - medical: Not on  file  . Transportation needs - non-medical: Not on file  Occupational History  . Not on file  Tobacco Use  . Smoking status: Never Smoker  . Smokeless tobacco: Never Used  Substance and Sexual Activity  . Alcohol use: Yes  . Drug use: No  . Sexual activity: Not on file  Other Topics Concern  . Not on file  Social History Narrative  . Not on file    Review of Systems: See HPI, otherwise negative ROS  Physical Exam: BP 111/67   Pulse 83   Temp 98.3 F (36.8 C) (Tympanic)   Resp 17   Ht 5\' 4"  (1.626 m)   Wt 160 lb (72.6 kg)   SpO2 100%   BMI 27.46 kg/m  General:   Alert,  pleasant and cooperative in NAD Head:  Normocephalic and atraumatic. Neck:  Supple; no masses or thyromegaly. Lungs:  Clear throughout to auscultation, normal respiratory effort.    Heart:  +S1, +S2, Regular rate and rhythm, No edema. Abdomen:  Soft, nontender and nondistended. Normal bowel sounds, without guarding, and without rebound.   Neurologic:  Alert and  oriented x4;  grossly normal neurologically.  Impression/Plan: Carmen Irwin is here for an colonoscopy to be performed for Screening colonoscopy average risk   Risks, benefits, limitations, and alternatives regarding  colonoscopy have been reviewed with the patient.  Questions have been answered.  All parties agreeable.   Sharlet Salina  Tobi BastosAnna, MD  09/10/2017, 9:33 AM

## 2017-09-10 NOTE — Anesthesia Post-op Follow-up Note (Signed)
Anesthesia QCDR form completed.        

## 2017-09-11 ENCOUNTER — Encounter: Payer: Self-pay | Admitting: Gastroenterology

## 2017-09-23 IMAGING — CT CT ANGIO CHEST
2 of 6 series · 19 of 46 positions shown · IV contrast (APPLIED)
Comparison: Radiographs of same day.

CLINICAL DATA: Shortness of breath.  Elevated D-dimer level.

EXAM:
CT ANGIOGRAPHY CHEST WITH CONTRAST
TECHNIQUE: Multidetector CT imaging of the chest was performed using the
standard protocol during bolus administration of intravenous
contrast. Multiplanar CT image reconstructions and MIPs were
obtained to evaluate the vascular anatomy.
CONTRAST:  75 mL of Isovue 370 intravenously.

[Series 5: thins · axial · 0.64mm/px · z∈[-319,-79]mm · 17 of 264 slices shown]
[im 12/264  lung]
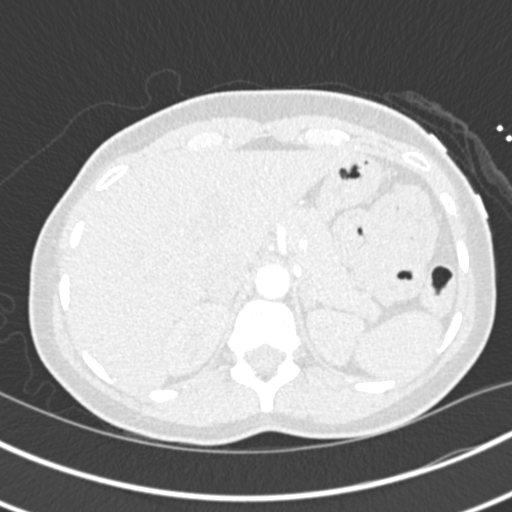
[im 23/264  soft-tissue]
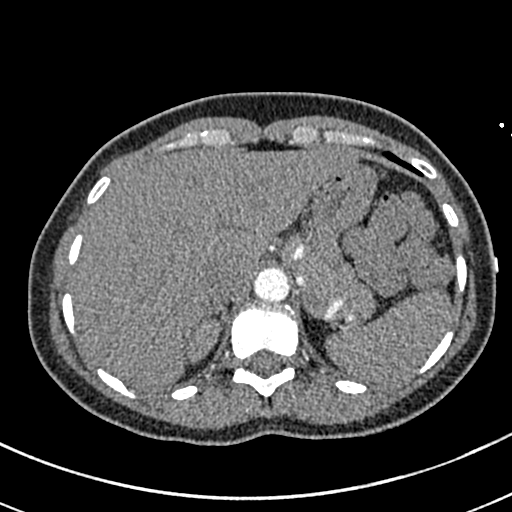
[im 46/264  lung]
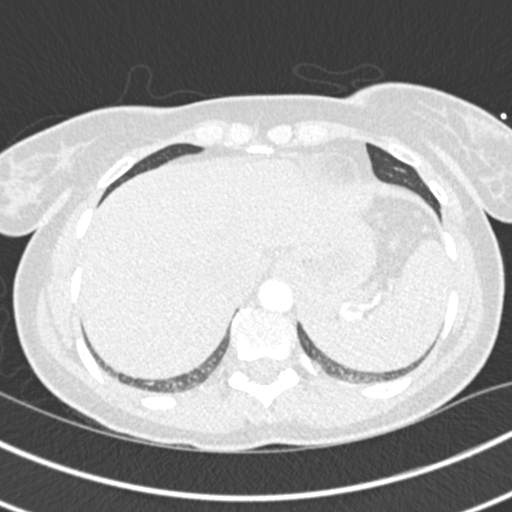
[im 58/264  soft-tissue]
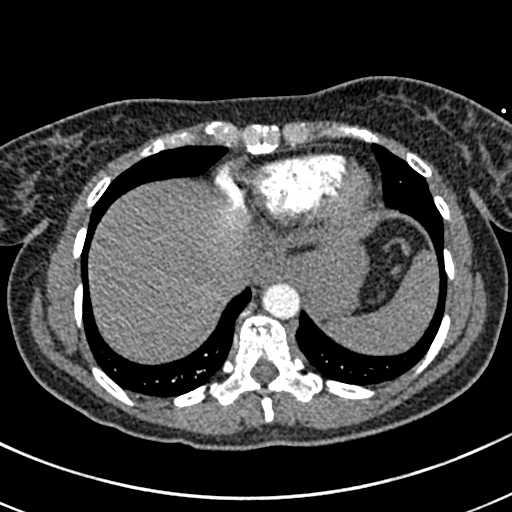
[im 69/264  lung]
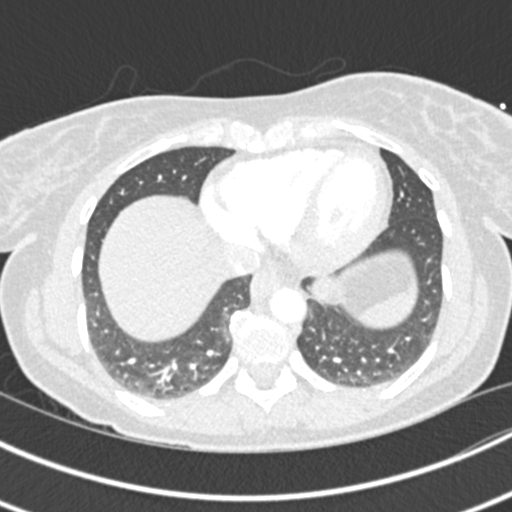
[im 92/264  soft-tissue]
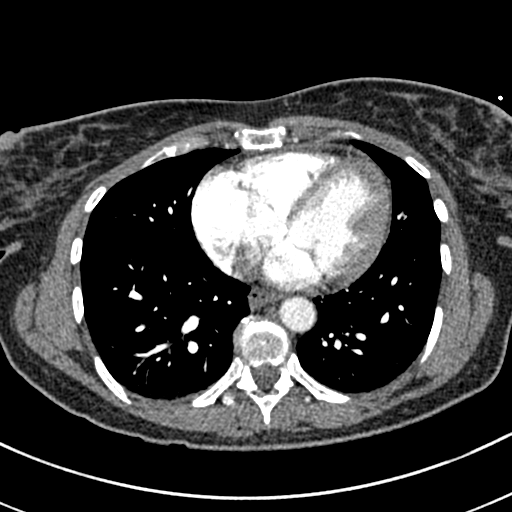
[im 103/264  lung]
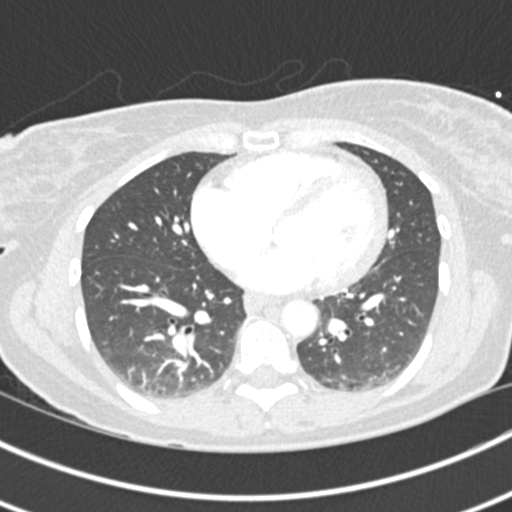
[im 115/264  soft-tissue]
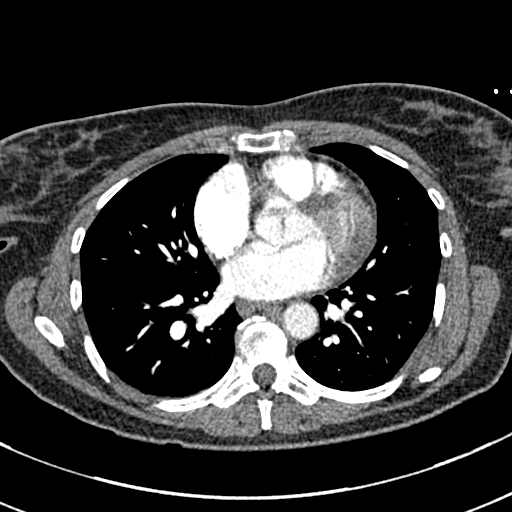
[im 138/264  lung]
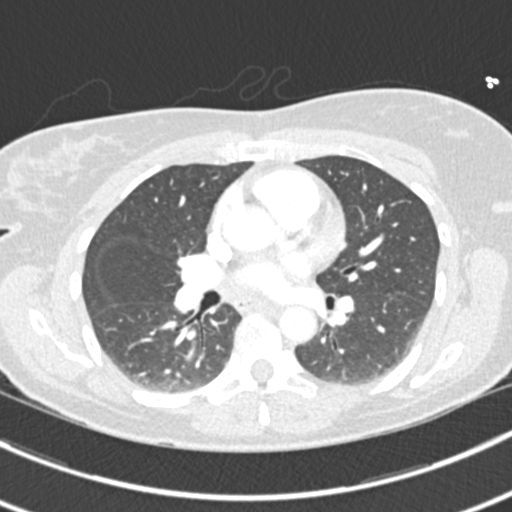
[im 149/264  soft-tissue]
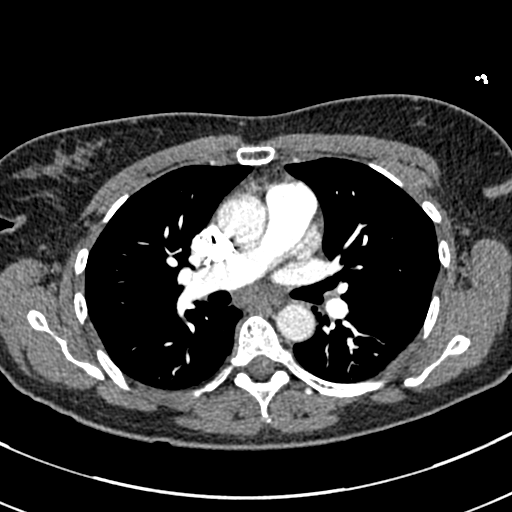
[im 161/264  lung]
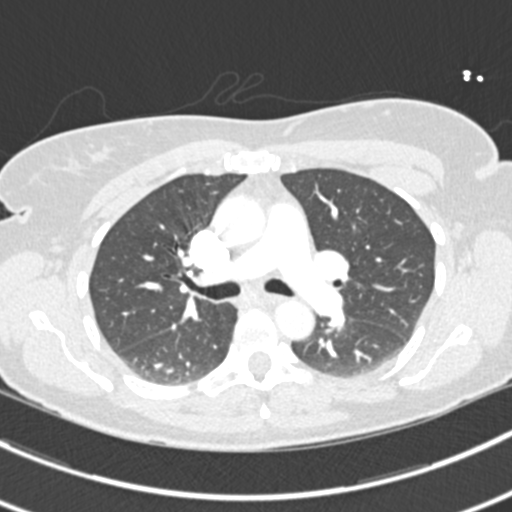
[im 172/264  soft-tissue]
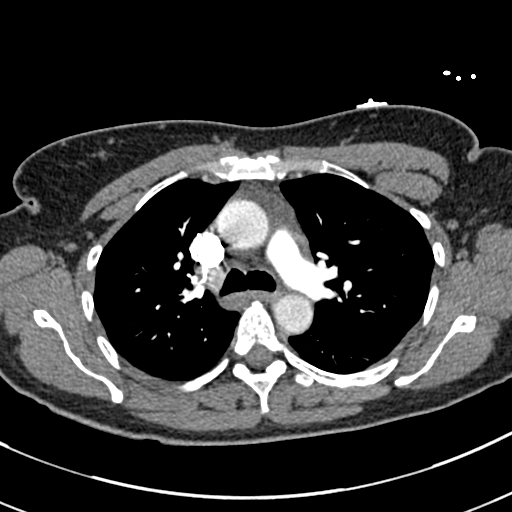
[im 195/264  lung]
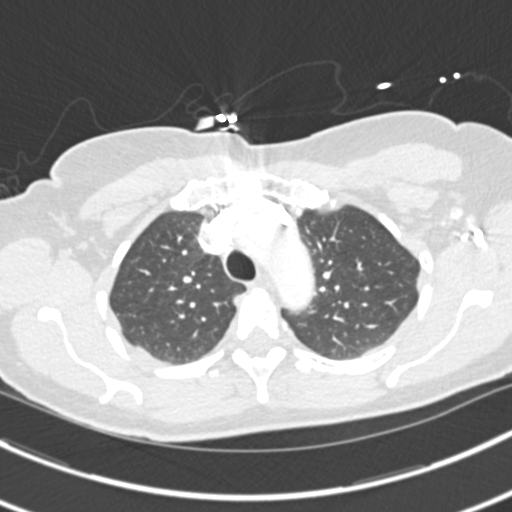
[im 206/264  soft-tissue]
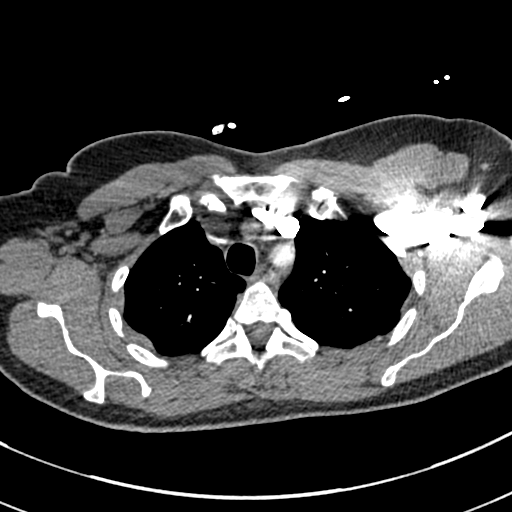
[im 218/264  lung]
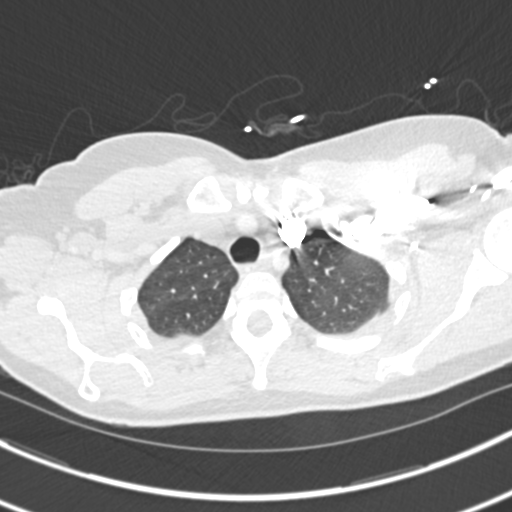
[im 241/264  soft-tissue]
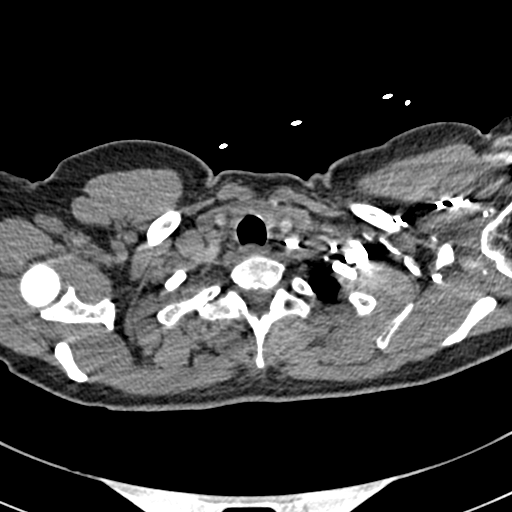
[im 252/264  lung]
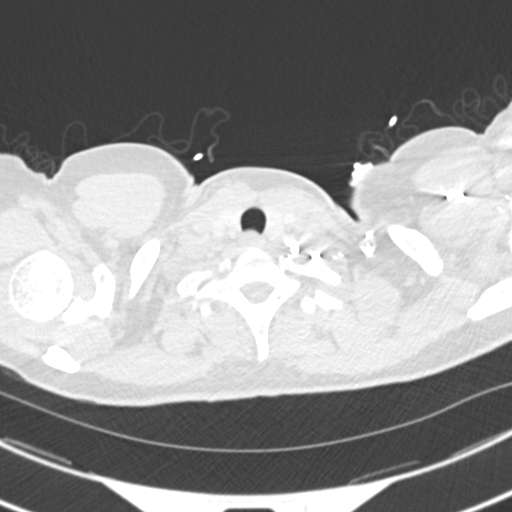

[Series 7: coronal mpr · coronal · 0.52mm/px · 2 of 75 slices shown]
[im 25/75  soft-tissue]
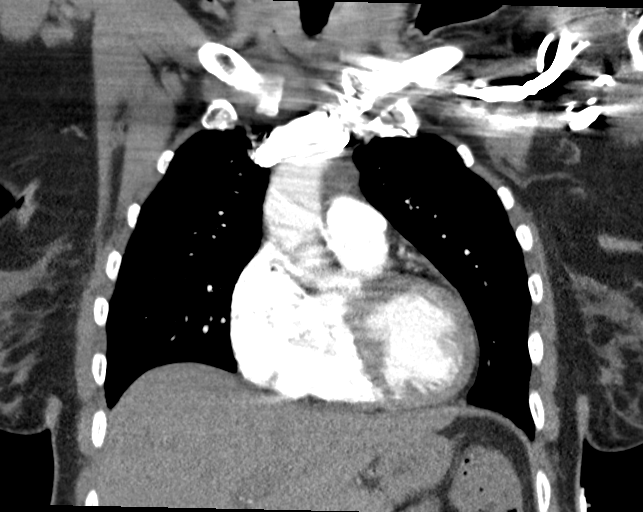
[im 50/75  soft-tissue]
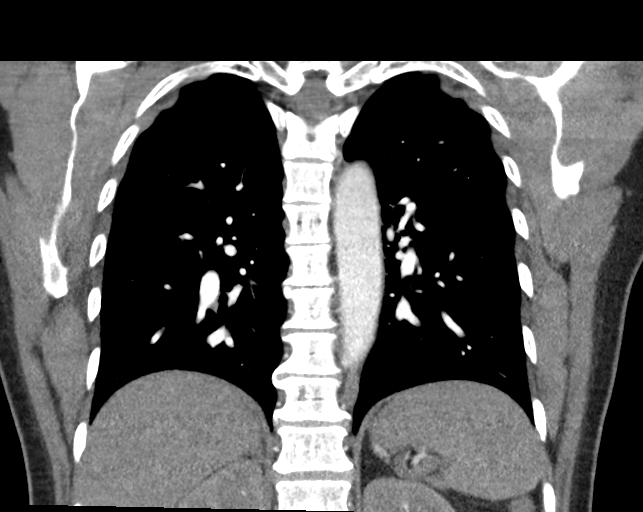

[19 of 46 positions shown; findings below may reference images not displayed]

FINDINGS: Cardiovascular: Satisfactory opacification of the pulmonary arteries
to the segmental level. No evidence of pulmonary embolism. Normal
heart size. No pericardial effusion.

Mediastinum/Nodes: No enlarged mediastinal, hilar, or axillary lymph
nodes. Thyroid gland, trachea, and esophagus demonstrate no
significant findings.

Lungs/Pleura: Lungs are clear. No pleural effusion or pneumothorax.

Upper Abdomen: No acute abnormality.

Musculoskeletal: No chest wall abnormality. No acute or significant
osseous findings.

Review of the MIP images confirms the above findings.
IMPRESSION: No definite evidence of pulmonary embolus. No acute abnormality seen
in the chest.

## 2018-07-16 ENCOUNTER — Other Ambulatory Visit: Payer: Self-pay

## 2018-07-16 ENCOUNTER — Ambulatory Visit
Admission: EM | Admit: 2018-07-16 | Discharge: 2018-07-16 | Disposition: A | Discharge status: Home / Self Care | Payer: BLUE CROSS/BLUE SHIELD | Attending: Family Medicine | Admitting: Family Medicine

## 2018-07-16 ENCOUNTER — Encounter: Payer: Self-pay | Admitting: Emergency Medicine

## 2018-07-16 DIAGNOSIS — B029 Zoster without complications: Secondary | ICD-10-CM

## 2018-07-16 MED ORDER — TRAMADOL HCL 50 MG PO TABS: 50.0000 mg | ORAL_TABLET | Freq: Three times a day (TID) | ORAL | 0 refills | Status: AC | PRN

## 2018-07-16 MED ORDER — VALACYCLOVIR HCL 1 G PO TABS: 1000.0000 mg | ORAL_TABLET | Freq: Three times a day (TID) | ORAL | 0 refills | Status: AC

## 2018-07-16 NOTE — ED Triage Notes (Signed)
Patient in today c/o painful rash on her right side x 3 days.

## 2018-07-16 NOTE — ED Provider Notes (Signed)
[facility] ____________________________________________  Time seen: Approximately 4:03 PM  I have reviewed the triage vital signs and the nursing notes.   HISTORY  Chief Complaint Herpes Zoster  HPI Carmen Irwin is a 52 y.o. female presenting for evaluation of rash to right flank.  This rash is been present for the last 3 days and is tender.  States rash currently mild tender.  Denies known trigger.  Denies any changes in foods, medicines, lotions, detergents or other contacts.   Denies others with similar.  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  Does report she had chickenpox as a child.  Denies recent fevers.  Has continued to eat and drink well.  Denies chest pain, shortness of breath or abdominal pain.  Has applied hydrocortisone cream and topical Neosporin without any improvement.  Did take some Aleve earlier as well without change.  Denies other aggravating or alleviating factors.  Was otherwise feels well denies other complaints.  Elgie Collard, MD: PCP                                                                                                      Past Medical History:  Diagnosis Date  . Impingement syndrome of right shoulder   . Rotator cuff tendinitis   . SVT (supraventricular tachycardia) (HCC)     There are no active problems to display for this patient.   Past Surgical History:  Procedure Laterality Date  . COLONOSCOPY WITH PROPOFOL N/A 09/10/2017   Procedure: COLONOSCOPY WITH PROPOFOL;  Surgeon: Jonathon Bellows, MD;  Location: [facility] ENDOSCOPY;  Service: Gastroenterology;  Laterality: N/A;  . ENDOMETRIAL ABLATION    . PELVIC LAPAROSCOPY       No current facility-administered medications for this encounter.   Current Outpatient Medications:  .  acidophilus (RISAQUAD) CAPS capsule, Take 1 capsule by mouth daily., Disp: , Rfl:  .   fluticasone (FLONASE) 50 MCG/ACT nasal spray, Place 2 sprays into both nostrils daily., Disp: 16 g, Rfl: 0 .  Multiple Vitamin (MULTIVITAMIN) tablet, Take 1 tablet by mouth daily., Disp: , Rfl:  .  progesterone (PROMETRIUM) 200 MG capsule, TK ONE C PO HS, Disp: , Rfl: 0 .  traMADol (ULTRAM) 50 MG tablet, Take 1 tablet (50 mg total) by mouth every 8 (eight) hours as needed for moderate pain or severe pain (do not drive while taking)., Disp: 12 tablet, Rfl: 0 .  valACYclovir (VALTREX) 1000 MG tablet, Take 1 tablet (1,000 mg total) by mouth 3 (three) times daily for 7 days., Disp: 21 tablet, Rfl: 0  Allergies Oxycodone; Shellfish allergy; and Sulfa antibiotics  Family History  Problem Relation Age of Onset  . Multiple myeloma Mother   . Diabetes Mother   . Diabetes Father   . Hyperlipidemia Father   . Alcohol abuse Father     Social History Social History   Tobacco Use  . Smoking status: Never Smoker  . Smokeless tobacco: Never Used  Substance Use Topics  . Alcohol use: Yes    Comment: social  . Drug use: No    Review of Systems Constitutional: No fever/ Cardiovascular: Denies chest pain. Respiratory: Denies shortness of breath. Gastrointestinal: No abdominal pain.   Musculoskeletal: Negative for back pain. Skin: positive for rash.  ____________________________________________   PHYSICAL EXAM:  VITAL SIGNS: ED Triage Vitals  Enc Vitals Group     BP 07/16/18 1459 118/83     Pulse Rate 07/16/18 1459 75     Resp 07/16/18 1459 16     Temp 07/16/18 1459 98.3 F (36.8 C)     Temp Source 07/16/18 1459 Oral     SpO2 07/16/18 1459 100 %     Weight 07/16/18 1459 165 lb (74.8 kg)     Height 07/16/18 1459 5' 4"  (1.626 m)     Head Circumference --      Peak Flow --      Pain Score 07/16/18 1458 7     Pain Loc --      Pain Edu? --      Excl. in [place]? --     Constitutional: Alert and oriented. Well appearing and in no acute distress. ENT      Head: Normocephalic and  atraumatic. Cardiovascular: Normal rate, regular rhythm. Grossly normal heart sounds.  Good peripheral circulation. Respiratory: Normal respiratory effort without tachypnea nor retractions. Breath sounds are clear and equal bilaterally. No wheezes, rales, rhonchi. Gastrointestinal: Soft and nontender. Musculoskeletal:  Steady gait. Neurologic:  Normal speech and language.  Speech is normal. No gait instability.  Skin:  Skin is warm, dry. Except: Tender erythematous vesicular rash in a cluster present to right lateral flank with smaller cluster to right posterior flank, no surrounding erythema, no drainage, no other rash noted. Psychiatric: Mood and affect are normal. Speech and behavior are normal. Patient exhibits appropriate insight and judgment   ___________________________________________   LABS (all labs ordered are listed, but only abnormal results are displayed)  Labs Reviewed - No data to display ____________________________________________   PROCEDURES Procedures   INITIAL IMPRESSION / ASSESSMENT AND PLAN / ED COURSE  Pertinent labs & imaging results that were available during my care of the patient were reviewed by me and considered in my medical decision making (see chart for details).  Well-appearing patient.  No acute distress.  Rash clinical appearance consistent with shingles.  Will treat with oral Valtrex, supportive care and PRN tramadol as needed for breakthrough pain.  Discussed follow-up and return parameters as well as transmission rest.Discussed indication, risks and benefits of medications with patient.  Discussed follow up with Primary care physician this week as needed. Discussed follow up and return parameters including no resolution or any worsening concerns. Patient verbalized understanding and agreed to plan.   [place] controlled substance database reviewed, no recent controlled substance  documented.  ____________________________________________   FINAL CLINICAL IMPRESSION(S) / ED DIAGNOSES  Final diagnoses:  Herpes zoster without complication     ED Discharge Orders         Ordered    valACYclovir (VALTREX) 1000 MG tablet  3 times daily     07/16/18 1517    traMADol (ULTRAM) 50 MG tablet  Every 8 hours PRN     07/16/18 1517           Note: This dictation was prepared with Dragon dictation along with smaller phrase technology. Any transcriptional errors that result from this process are unintentional.  Marylene Land, NP 07/16/18 1610

## 2018-07-16 NOTE — Discharge Instructions (Addendum)
Take medication as prescribed. Rest. Drink plenty of fluids. Monitor closely.   Follow up with your primary care physician this week as needed. Return to Urgent care for new or worsening concerns.   

## 2019-10-13 ENCOUNTER — Ambulatory Visit: Payer: Self-pay | Attending: Internal Medicine

## 2019-10-13 DIAGNOSIS — Z23 Encounter for immunization: Secondary | ICD-10-CM | POA: Insufficient documentation

## 2019-10-13 NOTE — Progress Notes (Signed)
   Covid-19 Vaccination Clinic  Name:  Carmen Irwin    MRN: [id number] DOB: [birth year]  10/13/2019  [name] was observed post Covid-19 immunization for 15 minutes without incidence. She was provided with Vaccine Information Sheet and instruction to access the V-Safe system.   [name] was instructed to call 911 with any severe reactions post vaccine: Marland Kitchen Difficulty breathing  . Swelling of your face and throat  . A fast heartbeat  . A bad rash all over your body  . Dizziness and weakness    Immunizations Administered    Name Date Dose VIS Date Route   Moderna COVID-19 Vaccine 10/13/2019 10:27 AM 0.5 mL 07/22/2019 Intramuscular   Manufacturer: Moderna   Lot: [id number]   NDC: 51898-421-03

## 2019-11-11 ENCOUNTER — Ambulatory Visit: Payer: Self-pay | Attending: Internal Medicine

## 2019-11-11 ENCOUNTER — Other Ambulatory Visit: Payer: Self-pay

## 2019-11-11 DIAGNOSIS — Z23 Encounter for immunization: Secondary | ICD-10-CM

## 2019-11-11 NOTE — Progress Notes (Signed)
   Covid-19 Vaccination Clinic  Name:  Carmen Irwin    MRN: [id number] DOB: August 31, 1965  11/11/2019  [name] was observed post Covid-19 immunization for 15 minutes without incident. She was provided with Vaccine Information Sheet and instruction to access the V-Safe system.   [name] was instructed to call 911 with any severe reactions post vaccine: Marland Kitchen Difficulty breathing  . Swelling of face and throat  . A fast heartbeat  . A bad rash all over body  . Dizziness and weakness   Immunizations Administered    Name Date Dose VIS Date Route   Moderna COVID-19 Vaccine 11/11/2019 10:27 AM 0.5 mL 07/22/2019 Intramuscular   Manufacturer: Gala Murdoch   Lot: [id number]   NDC: 83151-761-60

## 2020-06-08 ENCOUNTER — Other Ambulatory Visit: Payer: Self-pay | Admitting: Obstetrics and Gynecology

## 2020-06-08 ENCOUNTER — Other Ambulatory Visit (HOSPITAL_COMMUNITY): Payer: Self-pay | Admitting: Obstetrics and Gynecology

## 2020-06-08 DIAGNOSIS — R102 Pelvic and perineal pain: Secondary | ICD-10-CM

## 2020-06-16 ENCOUNTER — Ambulatory Visit
Admission: RE | Admit: 2020-06-16 | Discharge: 2020-06-16 | Disposition: A | Discharge status: Home / Self Care | Payer: BC Managed Care – PPO | Source: Ambulatory Visit | Attending: Obstetrics and Gynecology | Admitting: Obstetrics and Gynecology

## 2020-06-16 ENCOUNTER — Other Ambulatory Visit: Payer: Self-pay

## 2020-06-16 DIAGNOSIS — R102 Pelvic and perineal pain: Secondary | ICD-10-CM | POA: Diagnosis present

## 2021-03-11 IMAGING — US US PELVIS COMPLETE WITH TRANSVAGINAL
1 series · 14 of 25 positions shown · non-contrast
Comparison: Ultrasound 08/20/2012

CLINICAL DATA: Pelvic pain

EXAM:
TRANSABDOMINAL AND TRANSVAGINAL ULTRASOUND OF PELVIS
TECHNIQUE: Both transabdominal and transvaginal ultrasound examinations of the
pelvis were performed. Transabdominal technique was performed for
global imaging of the pelvis including uterus, ovaries, adnexal
regions, and pelvic cul-de-sac. It was necessary to proceed with
endovaginal exam following the transabdominal exam to visualize the
uterus endometrium ovaries.

[Series 1: us pelvis complete with transvaginal · 0.21mm/px · 14 of 40 slices shown]
[im 1/40]
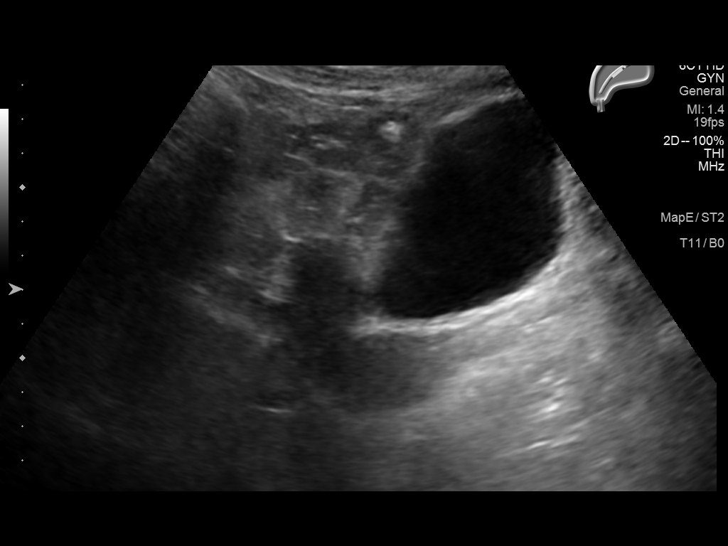
[im 4/40]
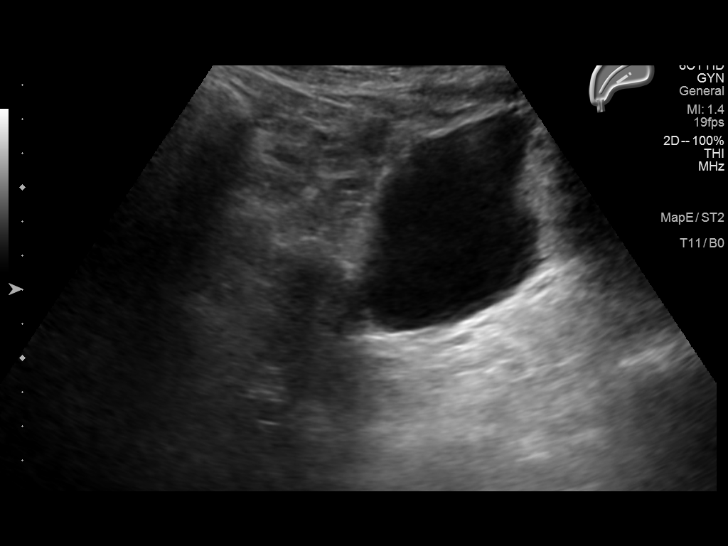
[im 7/40]
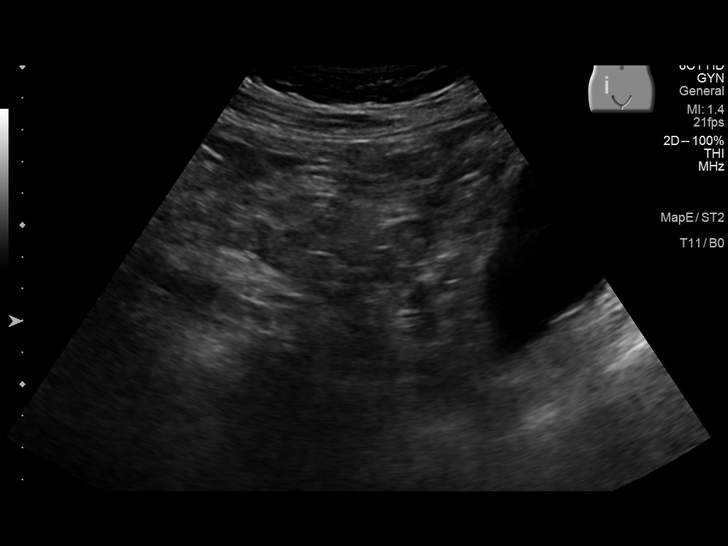
[im 10/40]
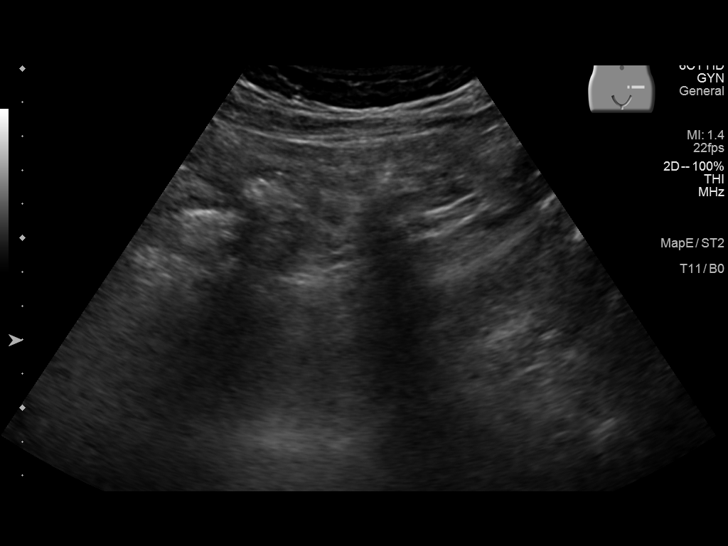
[im 14/40]
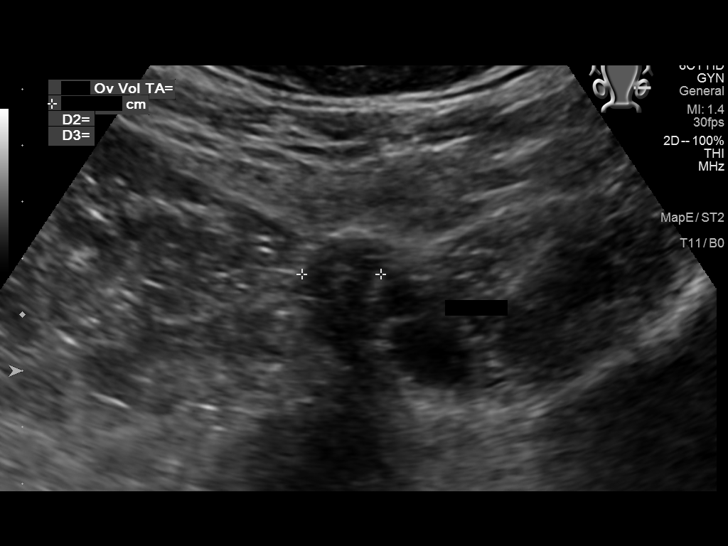
[im 15/40]
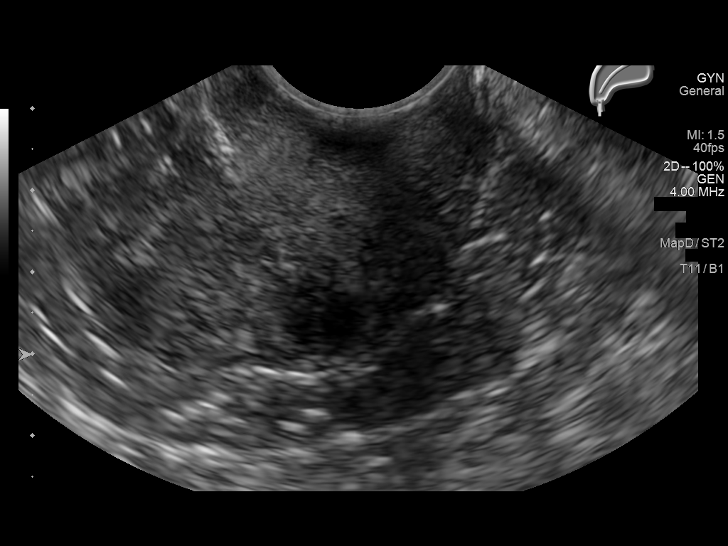
[im 18/40]
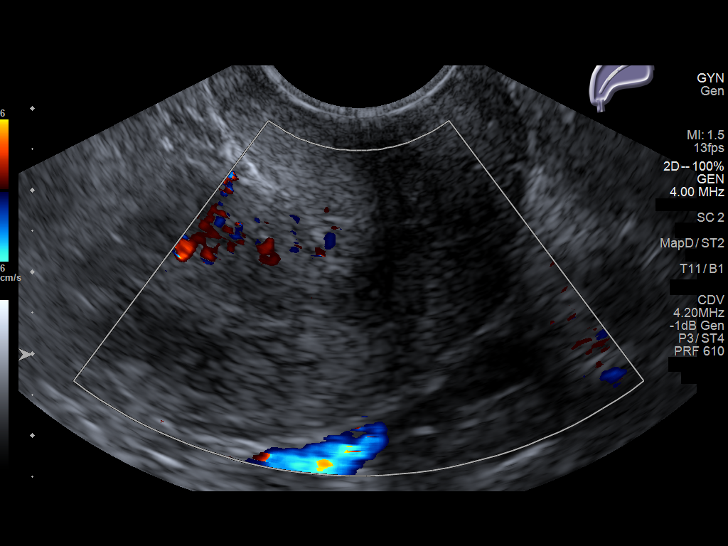
[im 22/40]
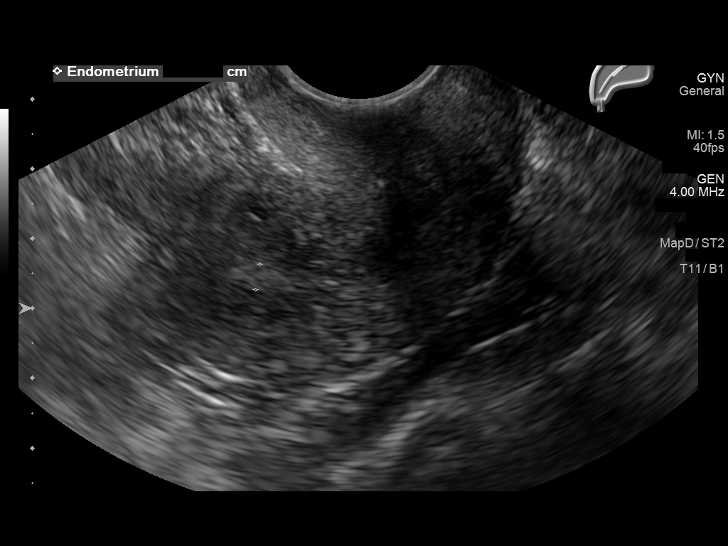
[im 25/40]
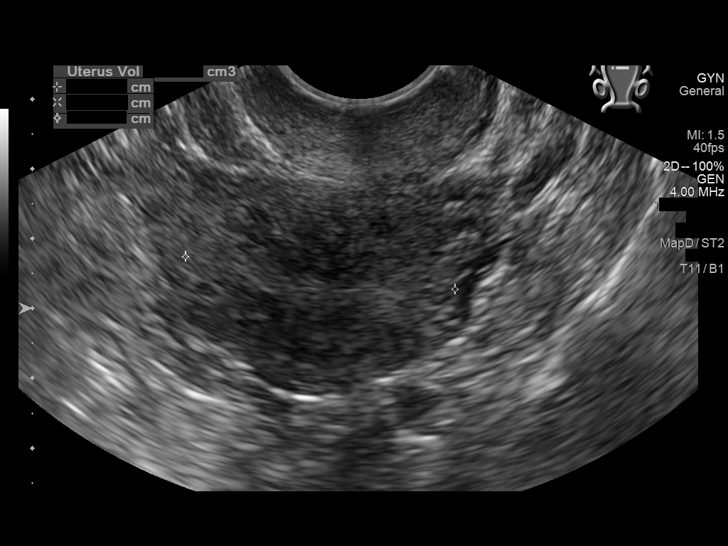
[im 27/40]
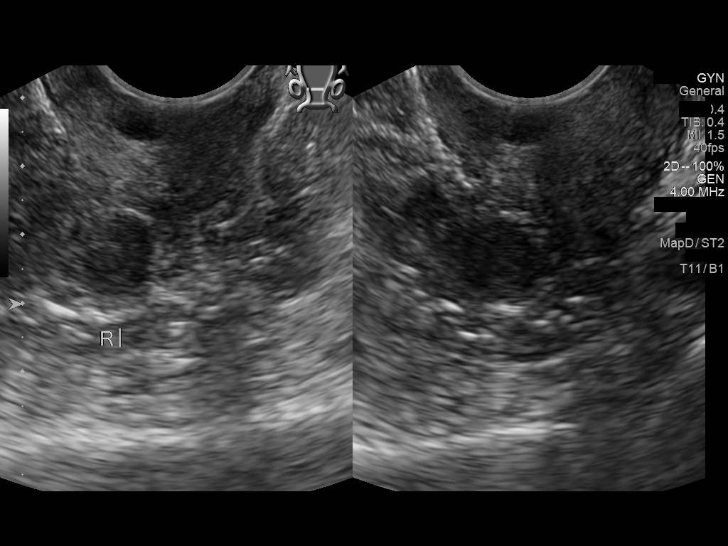
[im 30/40]
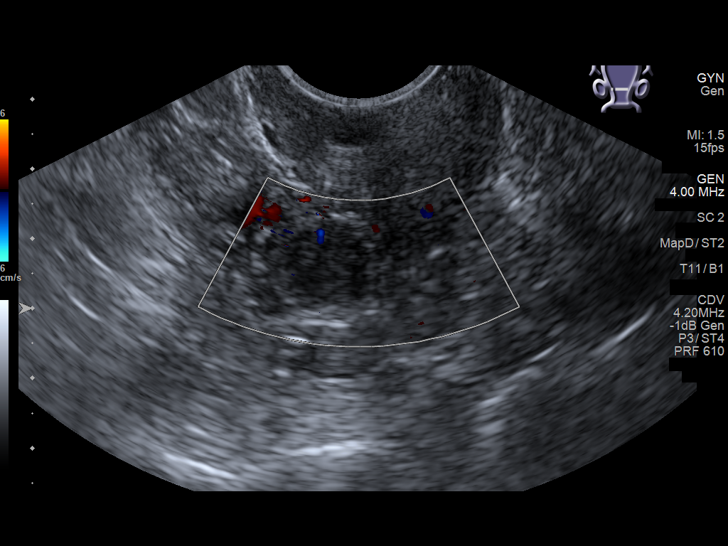
[im 33/40]
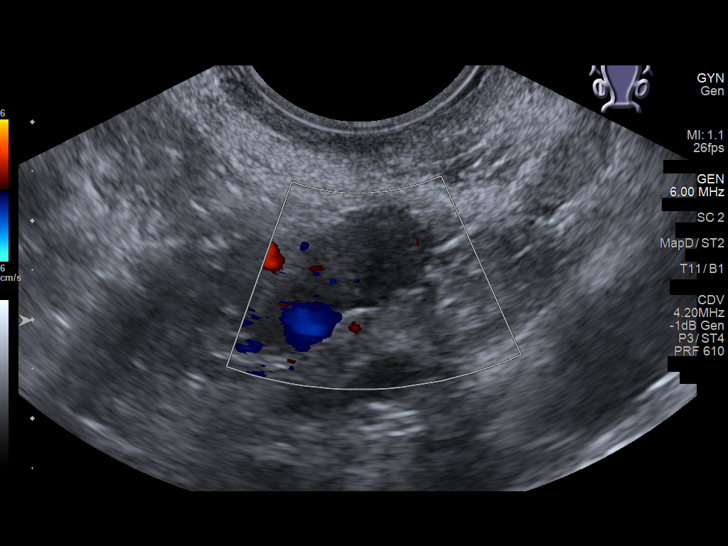
[im 36/40]
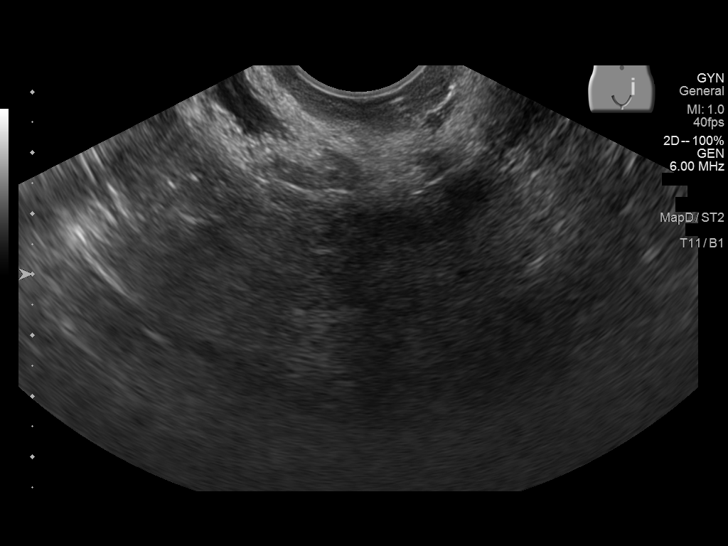
[im 40/40]
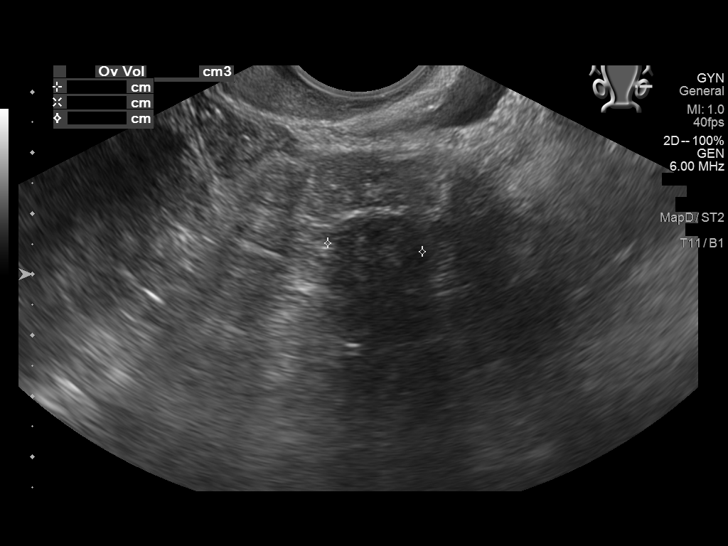

[14 of 25 positions shown; findings below may reference images not displayed]

FINDINGS: Uterus

Measurements: 4.7 x 3 x 3.9 cm = volume: 29 mL. At least 2
hypoechoic uterine masses. Subserosal posterior lower uterine
segment mass measuring 1.2 x 1.2 x 1.2 cm. Right posterior
intramural corpus mass measuring 1.1 x 1.1 x 0.9 cm.

Endometrium

Thickness: 3.7 mm.  No focal abnormality visualized.

Right ovary

Measurements: 1.9 x 0.9 x 1.7 cm = volume: 1.5 mL. Normal
appearance/no adnexal mass.

Left ovary

Measurements: 2.4 x 1.2 x 1.6 cm = volume: 2.4 mL. Normal
appearance/no adnexal mass.

Other findings

No abnormal free fluid.
IMPRESSION: 1. Small uterine fibroids.
2. Otherwise negative pelvic ultrasound.

## 2024-03-18 ENCOUNTER — Other Ambulatory Visit: Payer: Self-pay | Admitting: Medical Genetics

## 2024-03-20 ENCOUNTER — Other Ambulatory Visit
Admission: RE | Admit: 2024-03-20 | Discharge: 2024-03-20 | Disposition: A | Discharge status: Home / Self Care | Payer: Self-pay | Source: Ambulatory Visit | Attending: Medical Genetics | Admitting: Medical Genetics

## 2024-04-01 LAB — GENECONNECT MOLECULAR SCREEN: Genetic Analysis Overall Interpretation: NEGATIVE

## 2024-04-12 ENCOUNTER — Ambulatory Visit
Admission: RE | Admit: 2024-04-12 | Discharge: 2024-04-12 | Disposition: A | Discharge status: Home / Self Care | Source: Ambulatory Visit | Attending: Emergency Medicine | Admitting: Emergency Medicine

## 2024-04-12 VITALS — BP 137/88 | HR 76 | Temp 97.7°F | Resp 14 | Ht 64.0 in | Wt 164.9 lb

## 2024-04-12 DIAGNOSIS — R300 Dysuria: Secondary | ICD-10-CM | POA: Insufficient documentation

## 2024-04-12 LAB — URINALYSIS, W/ REFLEX TO CULTURE (INFECTION SUSPECTED)
Bilirubin Urine: NEGATIVE
Glucose, UA: NEGATIVE mg/dL
Hgb urine dipstick: NEGATIVE
Ketones, ur: NEGATIVE mg/dL
Leukocytes,Ua: NEGATIVE
Nitrite: NEGATIVE
Protein, ur: NEGATIVE mg/dL
Specific Gravity, Urine: 1.01 (ref 1.005–1.030)
pH: 7 (ref 5.0–8.0)

## 2024-04-12 MED ORDER — PHENAZOPYRIDINE HCL 200 MG PO TABS: 200.0000 mg | ORAL_TABLET | Freq: Three times a day (TID) | ORAL | 0 refills | Status: AC

## 2024-04-12 NOTE — ED Triage Notes (Signed)
 Patient c/o dysuria and urinary frequency that started on Wed.  Patient denies hematuria.  Patient denies fevers.

## 2024-04-12 NOTE — ED Provider Notes (Signed)
 [facility]    CSN: [id number] Arrival date & time: 04/12/24  9048      History   Chief Complaint Chief Complaint  Patient presents with   Urinary Frequency   Dysuria    HPI Carmen Irwin is a 58 y.o. female.   HPI  58 year old female with past medical history significant for right rotator cuff impingement and SVT presents for evaluation of UTI symptoms that began 3 days ago.  She is reporting dysuria, urgency, and frequency.  She denies fever, low back pain, abdominal pain, nausea, vomiting, or blood in her urine.  Patient does have a history of UTIs.  She denies any vaginal complaints.  Past Medical History:  Diagnosis Date   Impingement syndrome of right shoulder    Rotator cuff tendinitis    SVT (supraventricular tachycardia) (HCC)     There are no active problems to display for this patient.   Past Surgical History:  Procedure Laterality Date   COLONOSCOPY WITH PROPOFOL  N/A 09/10/2017   Procedure: COLONOSCOPY WITH PROPOFOL ;  Surgeon: Therisa Bi, MD;  Location: [facility] ENDOSCOPY;  Service: Gastroenterology;  Laterality: N/A;   ENDOMETRIAL ABLATION     PELVIC LAPAROSCOPY      OB History   No obstetric history on file.      Home Medications    Prior to Admission medications   Medication Sig Start Date End Date Taking? Authorizing Provider  phenazopyridine  (PYRIDIUM ) 200 MG tablet Take 1 tablet (200 mg total) by mouth 3 (three) times daily. 04/12/24  Yes Bernardino Ditch, NP  acidophilus (RISAQUAD) CAPS capsule Take 1 capsule by mouth daily.    [provider]  estradiol (VIVELLE-DOT) 0.0375 MG/24HR 1 patch 2 (two) times a week.    [provider]  fluticasone  (FLONASE ) 50 MCG/ACT nasal spray Place 2 sprays into both nostrils daily. 11/13/15   Lacinda Elsie SQUIBB, PA-C  Multiple Vitamin (MULTIVITAMIN) tablet Take 1 tablet by mouth daily.    [provider]  progesterone (PROMETRIUM) 200 MG capsule TK ONE C PO HS 06/21/18   [provider]  traMADol  (ULTRAM ) 50 MG tablet Take 1 tablet (50 mg total) by mouth every 8 (eight) hours as needed for moderate pain or severe pain (do not drive while taking). 07/16/18   Cleotilde Jacobsen, NP    Family History Family History  Problem Relation Age of Onset   Multiple myeloma Mother    Diabetes Mother    Diabetes Father    Hyperlipidemia Father    Alcohol abuse Father     Social History Social History   Tobacco Use   Smoking status: Never   Smokeless tobacco: Never  Vaping Use   Vaping status: Never Used  Substance Use Topics   Alcohol use: Yes    Comment: social   Drug use: No     Allergies   Oxycodone, Shellfish allergy, and Sulfa antibiotics   Review of Systems Review of Systems  Constitutional:  Negative for fever.  Gastrointestinal:  Negative for abdominal pain, nausea and vomiting.  Genitourinary:  Positive for dysuria, frequency and urgency. Negative for hematuria, vaginal discharge and vaginal pain.  Musculoskeletal:  Negative for back pain.     Physical Exam Triage Vital Signs ED Triage Vitals  Encounter Vitals Group     BP      Girls Systolic BP Percentile      Girls Diastolic BP Percentile      Boys Systolic BP Percentile      Boys  Diastolic BP Percentile      Pulse      Resp      Temp      Temp src      SpO2      Weight      Height      Head Circumference      Peak Flow      Pain Score      Pain Loc      Pain Education      Exclude from Growth Chart    No data found.  Updated Vital Signs BP 137/88 (BP Location: Right Arm)   Pulse 76   Temp 97.7 F (36.5 C) (Oral)   Resp 14   Ht 5' 4 (1.626 m)   Wt 164 lb 14.5 oz (74.8 kg)   SpO2 97%   BMI 28.31 kg/m   Visual Acuity Right Eye Distance:   Left Eye Distance:   Bilateral Distance:    Right Eye Near:   Left Eye Near:    Bilateral Near:     Physical Exam Vitals and nursing note reviewed.  Constitutional:      Appearance: Normal appearance. She is not  ill-appearing.  HENT:     Head: Normocephalic and atraumatic.  Cardiovascular:     Rate and Rhythm: Normal rate and regular rhythm.     Pulses: Normal pulses.     Heart sounds: Normal heart sounds. No murmur heard.    No friction rub. No gallop.  Pulmonary:     Effort: Pulmonary effort is normal.     Breath sounds: Normal breath sounds. No wheezing, rhonchi or rales.  Abdominal:     Tenderness: There is no right CVA tenderness or left CVA tenderness.  Skin:    General: Skin is warm and dry.     Capillary Refill: Capillary refill takes less than 2 seconds.     Findings: No rash.  Neurological:     General: No focal deficit present.     Mental Status: She is alert and oriented to person, place, and time.      UC Treatments / Results  Labs (all labs ordered are listed, but only abnormal results are displayed) Labs Reviewed  URINALYSIS, W/ REFLEX TO CULTURE (INFECTION SUSPECTED) - Abnormal; Notable for the following components:      Result Value   Color, Urine STRAW (*)    Bacteria, UA MANY (*)    All other components within normal limits  CERVICOVAGINAL ANCILLARY ONLY    EKG   Radiology No results found.  Procedures Procedures (including critical care time)  Medications Ordered in UC Medications - No data to display  Initial Impression / Assessment and Plan / UC Course  I have reviewed the triage vital signs and the nursing notes.  Pertinent labs & imaging results that were available during my care of the patient were reviewed by me and considered in my medical decision making (see chart for details).   Patient is a pleasant, nontoxic-appearing 58 year old female presenting for evaluation of UTI symptoms as outlined in HPI above.  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  She denies any other contributing factors such as hot tub exposure or intercourse.   She denies any low back pain, abdominal pain, hematuria, or nocturia.  She has no CVA tenderness on exam.  She describes a feeling as if she  is coming down with the flu but has no respiratory problems and she denies any vaginal discharge or itching.  I will order a urinalysis to assess for the presence of UTI.  Urinalysis is negative for leukocyte esterase, nitrates, protein, ketones, hemoglobin, and glucose.  Reflex microscopy shows many bacteria but is otherwise unremarkable.  Even though patient is denying vaginal symptoms I am going to request that she collect a wet prep to evaluate for possible BV or yeast.  I will discharge patient with diagnosis of dysuria with prescription for Pyridium  to help with the urinary discomfort pending the wet prep results.   Final Clinical Impressions(s) / UC Diagnoses   Final diagnoses:  Dysuria     Discharge Instructions      The urine sample you provided for us  did not show any evidence of infection.  Your vaginal swab is pending.  Please use the Pyridium  every 8 hours to help you with the urinary discomfort.  It will turn your urine to very vivid red-orange.  Increase your oral fluid intake so that you increase urine production to help flush your urinary tract.  If you develop any increased pain, nausea or vomiting you cannot get down fluids, or fever you need to go to the ER for evaluation.     ED Prescriptions     Medication Sig Dispense Auth. Provider   phenazopyridine  (PYRIDIUM ) 200 MG tablet Take 1 tablet (200 mg total) by mouth 3 (three) times daily. 6 tablet Bernardino Ditch, NP      PDMP not reviewed this encounter.   Bernardino Ditch, NP 04/12/24 1031

## 2024-04-12 NOTE — Discharge Instructions (Addendum)
 The urine sample you provided for us  did not show any evidence of infection.  Your vaginal swab is pending.  Please use the Pyridium  every 8 hours to help you with the urinary discomfort.  It will turn your urine to very vivid red-orange.  Increase your oral fluid intake so that you increase urine production to help flush your urinary tract.  If you develop any increased pain, nausea or vomiting you cannot get down fluids, or fever you need to go to the ER for evaluation.

## 2024-04-14 ENCOUNTER — Telehealth: Payer: Self-pay | Admitting: Physician Assistant

## 2024-04-14 LAB — CERVICOVAGINAL ANCILLARY ONLY
Bacterial Vaginitis (gardnerella): POSITIVE — AB
Candida Glabrata: NEGATIVE
Candida Vaginitis: NEGATIVE
Comment: NEGATIVE
Comment: NEGATIVE
Comment: NEGATIVE

## 2024-04-14 MED ORDER — METRONIDAZOLE 500 MG PO TABS: 500.0000 mg | ORAL_TABLET | Freq: Two times a day (BID) | ORAL | 0 refills | Status: AC

## 2024-04-14 NOTE — Telephone Encounter (Signed)
 Positive BV test.  Patient desires treatment.  Sent Metronidazolel to pharmacy.

## 2024-04-15 ENCOUNTER — Ambulatory Visit (HOSPITAL_COMMUNITY): Payer: Self-pay

## 2024-10-31 ENCOUNTER — Ambulatory Visit: Admitting: Student
